# Patient Record
Sex: Female | Born: 2000 | Race: White | Hispanic: No | Marital: Single | Smoking: Never smoker
Health system: Southern US, Community
[De-identification: ages and names within clinical notes are randomized; demographics above are authoritative.]

## PROBLEM LIST (undated history)

## (undated) DIAGNOSIS — Z789 Other specified health status: Secondary | ICD-10-CM

## (undated) DIAGNOSIS — I10 Essential (primary) hypertension: Secondary | ICD-10-CM

## (undated) HISTORY — PX: NO PAST SURGERIES: SHX2092

---
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
## 2020-11-27 NOTE — L&D Delivery Note (Signed)
OB/GYN Faculty Practice Delivery Note  Pamela Cisneros is a 20 y.o. G1P0 s/p SVD at [redacted]w[redacted]d. She was admitted for IOL gHTN.   ROM: 6h 43m with clear fluid GBS Status: positive Maximum Maternal Temperature: 100.1  Labor Progress: Presented for IOL, had foley bulb placed while boarding in MAU and received cytotec x1, then was started on pitocin and AROMed and progressed to compelete  Delivery Date/Time: 0831 on 12/29 Delivery: Called to room and patient was complete and pushing. Head delivered LOA. No nuchal cord present. Shoulder and body delivered in usual fashion. Infant with spontaneous cry, placed on mother's abdomen, dried and stimulated. Cord clamped x 2 after 1-minute delay, and cut by father of baby Lars Mage. Cord blood drawn. Placenta delivered spontaneously with gentle cord traction. Fundus firm with massage and Pitocin. Labia, perineum, vagina, and cervix inspected and found  to have a 2nd degree perineal laceration with a left labial extension as well as a miniscule right periurethral. The 2nd degree laceration was repaired with a 3-0 vicryl and the labial extension was repaired with  a 4-0 monocryl. There was once interrupted stitch placed in the right periurethral tear.   Of note, there was significant bleeding from the perineal laceration and therefore patient was given TXA and there was excellent hemostasis once the repair was complete.  Placenta: intact, to L&D Complications: None Lacerations: 2nd degree perineal, left labial extension, and right periurethral  EBL: 700 Analgesia: epidural   Infant: female   APGARs 8,9   weight pending  Warner Mccreedy, MD, MPH OB Fellow, Faculty Practice Center for [facility], [facility] 11/24/2021, 9:30 AM

## 2021-01-07 ENCOUNTER — Other Ambulatory Visit: Payer: Self-pay

## 2021-01-07 ENCOUNTER — Emergency Department (HOSPITAL_COMMUNITY)
Admission: EM | Admit: 2021-01-07 | Discharge: 2021-01-08 | Disposition: A | Discharge status: Home / Self Care | Payer: Medicaid - Out of State | Attending: Emergency Medicine | Admitting: Emergency Medicine

## 2021-01-07 DIAGNOSIS — J029 Acute pharyngitis, unspecified: Secondary | ICD-10-CM | POA: Insufficient documentation

## 2021-01-07 DIAGNOSIS — Z5321 Procedure and treatment not carried out due to patient leaving prior to being seen by health care provider: Secondary | ICD-10-CM | POA: Diagnosis not present

## 2021-01-07 LAB — CBC
HCT: 37.4 % (ref 36.0–46.0)
Hemoglobin: 11 g/dL — ABNORMAL LOW (ref 12.0–15.0)
MCH: 21.7 pg — ABNORMAL LOW (ref 26.0–34.0)
MCHC: 29.4 g/dL — ABNORMAL LOW (ref 30.0–36.0)
MCV: 73.8 fL — ABNORMAL LOW (ref 80.0–100.0)
Platelets: 519 10*3/uL — ABNORMAL HIGH (ref 150–400)
RBC: 5.07 MIL/uL (ref 3.87–5.11)
RDW: 15.8 % — ABNORMAL HIGH (ref 11.5–15.5)
WBC: 14.3 10*3/uL — ABNORMAL HIGH (ref 4.0–10.5)
nRBC: 0 % (ref 0.0–0.2)

## 2021-01-07 LAB — BASIC METABOLIC PANEL
Anion gap: 11 (ref 5–15)
BUN: 11 mg/dL (ref 6–20)
CO2: 24 mmol/L (ref 22–32)
Calcium: 9 mg/dL (ref 8.9–10.3)
Chloride: 97 mmol/L — ABNORMAL LOW (ref 98–111)
Creatinine, Ser: 0.63 mg/dL (ref 0.44–1.00)
GFR, Estimated: >60 mL/min (ref >60)
Glucose, Bld: 112 mg/dL — ABNORMAL HIGH (ref 70–99)
Potassium: 4 mmol/L (ref 3.5–5.1)
Sodium: 132 mmol/L — ABNORMAL LOW (ref 135–145)

## 2021-01-07 LAB — GROUP A STREP BY PCR: Group A Strep by PCR: NOT DETECTED

## 2021-01-07 NOTE — ED Triage Notes (Addendum)
Pt presents to ED POV. Pt c/o sore throat. Pt reports that she had tonsillitis on L side in July and feels like this is similar. Pt reports that she wa told that her tonsils need to be remove and she has been unable to do so d/t insurance. Airway intact

## 2021-01-08 ENCOUNTER — Encounter (HOSPITAL_COMMUNITY): Payer: Self-pay | Admitting: Emergency Medicine

## 2021-01-08 ENCOUNTER — Other Ambulatory Visit: Payer: Self-pay

## 2021-01-08 ENCOUNTER — Ambulatory Visit (HOSPITAL_COMMUNITY)
Admission: EM | Admit: 2021-01-08 | Discharge: 2021-01-08 | Disposition: A | Discharge status: Home / Self Care | Payer: Medicaid - Out of State | Attending: Emergency Medicine | Admitting: Emergency Medicine

## 2021-01-08 DIAGNOSIS — R509 Fever, unspecified: Secondary | ICD-10-CM

## 2021-01-08 DIAGNOSIS — J029 Acute pharyngitis, unspecified: Secondary | ICD-10-CM | POA: Insufficient documentation

## 2021-01-08 DIAGNOSIS — J039 Acute tonsillitis, unspecified: Secondary | ICD-10-CM | POA: Insufficient documentation

## 2021-01-08 LAB — POCT RAPID STREP A, ED / UC: Streptococcus, Group A Screen (Direct): NEGATIVE

## 2021-01-08 MED ORDER — AZITHROMYCIN 250 MG PO TABS: 250.0000 mg | ORAL_TABLET | Freq: Every day | ORAL | 0 refills | Status: DC

## 2021-01-08 NOTE — ED Triage Notes (Signed)
Pt is present today with a sore throat, fever, and neck pain. Pt states that she just recovered from tonsillitis in December. Pt states that she needs to have her tonsils removed but is waiting on her record from [facility] to [facility]

## 2021-01-08 NOTE — Discharge Instructions (Addendum)
Take the Zithromax as directed.  Take Tylenol or ibuprofen as needed for discomfort.  Follow-up with your primary care provider or an ENT.

## 2021-01-08 NOTE — ED Provider Notes (Signed)
[facility]    CSN: [id number] Arrival date & time: 01/08/21  1503      History   Chief Complaint Chief Complaint  Patient presents with  . Fever  . Sore Throat    HPI Pamela Cisneros is a 20 y.o. female.   Patient presents with 3-day history of fever, sore throat, tender swollen lymph nodes in her neck.  She denies rash, cough, shortness of breath, vomiting, diarrhea, or other symptoms.  Treatment attempted at home with Tylenol.  Patient states she has a history of tonsillitis which was last treated in July 2021.  She attempted to be seen in the ED yesterday but left before being seen due to the wait time.  The history is provided by the patient and medical records.    History reviewed. No pertinent past medical history.  There are no problems to display for this patient.   History reviewed. No pertinent surgical history.  OB History   No obstetric history on file.      Home Medications    Prior to Admission medications   Medication Sig Start Date End Date Taking? Authorizing Provider  azithromycin (ZITHROMAX) 250 MG tablet Take 1 tablet (250 mg total) by mouth daily. Take first 2 tablets together, then 1 every day until finished. 01/08/21  Yes Mickie Bail, NP    Family History Family History  Problem Relation Age of Onset  . Healthy Mother   . Healthy Father     Social History Social History   Tobacco Use  . Smoking status: Never Smoker  . Smokeless tobacco: Never Used  Vaping Use  . Vaping Use: Never used  Substance Use Topics  . Alcohol use: Never  . Drug use: Never     Allergies   Penicillins   Review of Systems Review of Systems  Constitutional: Positive for fever. Negative for chills.  HENT: Positive for sore throat. Negative for ear pain and trouble swallowing.   Eyes: Negative for pain and visual disturbance.  Respiratory: Negative for cough and shortness of breath.   Cardiovascular: Negative for chest pain and palpitations.   Gastrointestinal: Negative for abdominal pain, diarrhea and vomiting.  Genitourinary: Negative for dysuria and hematuria.  Musculoskeletal: Negative for arthralgias and back pain.  Skin: Negative for color change and rash.  Neurological: Negative for seizures and syncope.  All other systems reviewed and are negative.    Physical Exam Triage Vital Signs ED Triage Vitals  Enc Vitals Group     BP      Pulse      Resp      Temp      Temp src      SpO2      Weight      Height      Head Circumference      Peak Flow      Pain Score      Pain Loc      Pain Edu?      Excl. in GC?    No data found.  Updated Vital Signs BP 138/89 (BP Location: Left Arm)   Pulse (!) 121   Temp 99.6 F (37.6 C) (Oral)   Resp 17   LMP 12/17/2020   SpO2 100%   Visual Acuity Right Eye Distance:   Left Eye Distance:   Bilateral Distance:    Right Eye Near:   Left Eye Near:    Bilateral Near:     Physical Exam Vitals and nursing note  reviewed.  Constitutional:      General: She is not in acute distress.    Appearance: She is well-developed and well-nourished.  HENT:     Head: Normocephalic and atraumatic.     Right Ear: Tympanic membrane normal.     Left Ear: Tympanic membrane normal.     Nose: Nose normal.     Mouth/Throat:     Mouth: Mucous membranes are moist.     Pharynx: Uvula midline. Posterior oropharyngeal erythema present.     Tonsils: 2+ on the right. 2+ on the left.  Eyes:     Conjunctiva/sclera: Conjunctivae normal.  Cardiovascular:     Rate and Rhythm: Normal rate and regular rhythm.     Heart sounds: Normal heart sounds.  Pulmonary:     Effort: Pulmonary effort is normal. No respiratory distress.     Breath sounds: Normal breath sounds.  Abdominal:     Palpations: Abdomen is soft.     Tenderness: There is no abdominal tenderness.  Musculoskeletal:        General: No edema.     Cervical back: Neck supple.  Skin:    General: Skin is warm and dry.     Findings:  No rash.  Neurological:     General: No focal deficit present.     Mental Status: She is alert and oriented to person, place, and time.     Gait: Gait normal.  Psychiatric:        Mood and Affect: Mood and affect and mood normal.        Behavior: Behavior normal.      UC Treatments / Results  Labs (all labs ordered are listed, but only abnormal results are displayed) Labs Reviewed  CULTURE, GROUP A STREP [facility])  POCT RAPID STREP A, ED / UC    EKG   Radiology No results found.  Procedures Procedures (including critical care time)  Medications Ordered in UC Medications - No data to display  Initial Impression / Assessment and Plan / UC Course  I have reviewed the triage vital signs and the nursing notes.  Pertinent labs & imaging results that were available during my care of the patient were reviewed by me and considered in my medical decision making (see chart for details).   Acute tonsillitis and sore throat.  Rapid strep negative; culture pending.  Treating with Zithromax; Tylenol or ibuprofen as needed for discomfort.  Instructed patient to follow-up with her PCP or an ENT.  She agrees to plan of care.   Final Clinical Impressions(s) / UC Diagnoses   Final diagnoses:  Acute tonsillitis, unspecified etiology  Sore throat     Discharge Instructions     Take the Zithromax as directed.  Take Tylenol or ibuprofen as needed for discomfort.  Follow-up with your primary care provider or an ENT.        ED Prescriptions    Medication Sig Dispense Auth. Provider   azithromycin (ZITHROMAX) 250 MG tablet Take 1 tablet (250 mg total) by mouth daily. Take first 2 tablets together, then 1 every day until finished. 6 tablet Mickie Bail, NP     PDMP not reviewed this encounter.   Mickie Bail, NP 01/08/21 [phone]

## 2021-01-11 LAB — CULTURE, GROUP A STREP (THRC)

## 2021-03-29 ENCOUNTER — Other Ambulatory Visit: Payer: Self-pay

## 2021-03-29 ENCOUNTER — Ambulatory Visit (INDEPENDENT_AMBULATORY_CARE_PROVIDER_SITE_OTHER): Payer: Medicaid Other

## 2021-03-29 ENCOUNTER — Encounter: Payer: Self-pay | Admitting: Obstetrics and Gynecology

## 2021-03-29 VITALS — BP 131/83 | HR 72

## 2021-03-29 DIAGNOSIS — Z3201 Encounter for pregnancy test, result positive: Secondary | ICD-10-CM

## 2021-03-29 DIAGNOSIS — Z3200 Encounter for pregnancy test, result unknown: Secondary | ICD-10-CM

## 2021-03-29 LAB — POCT URINE PREGNANCY: Preg Test, Ur: POSITIVE — AB

## 2021-03-29 NOTE — Progress Notes (Signed)
Ms. Riche presents today for UPT. She does not have any pregnancy complaints.  LMP:02/18/2021    OBJECTIVE: Appears well, in no apparent distress.  OB History   No obstetric history on file.    Home UPT Result: positive  In-Office UPT result:positive  I have reviewed the patient's medical, obstetrical, social, and family histories, and medications.   ASSESSMENT: Positive pregnancy test    PLAN Prenatal care to be completed at: Patient will call back to schedule a new ob appointment.

## 2021-04-20 ENCOUNTER — Ambulatory Visit (INDEPENDENT_AMBULATORY_CARE_PROVIDER_SITE_OTHER): Payer: Medicaid Other

## 2021-04-20 ENCOUNTER — Other Ambulatory Visit: Payer: Self-pay

## 2021-04-20 VITALS — BP 119/77 | HR 82 | Ht 64.0 in | Wt 204.8 lb

## 2021-04-20 DIAGNOSIS — O3680X0 Pregnancy with inconclusive fetal viability, not applicable or unspecified: Secondary | ICD-10-CM

## 2021-04-20 DIAGNOSIS — Z3A08 8 weeks gestation of pregnancy: Secondary | ICD-10-CM

## 2021-04-20 DIAGNOSIS — Z3401 Encounter for supervision of normal first pregnancy, first trimester: Secondary | ICD-10-CM | POA: Insufficient documentation

## 2021-04-20 DIAGNOSIS — O219 Vomiting of pregnancy, unspecified: Secondary | ICD-10-CM

## 2021-04-20 MED ORDER — BLOOD PRESSURE KIT DEVI: 1.0000 | 0 refills | Status: AC

## 2021-04-20 MED ORDER — DICLEGIS 10-10 MG PO TBEC
2.0000 | DELAYED_RELEASE_TABLET | Freq: Every day | ORAL | 2 refills | Status: DC
Start: 2021-04-20 — End: 2021-05-16

## 2021-04-20 NOTE — Progress Notes (Signed)
Agree with A & P. 

## 2021-04-20 NOTE — Progress Notes (Signed)
New OB Intake  I connected with  Pamela Cisneros on 04/20/21 at  1:15 PM EDT by in office and verified that I am speaking with the correct person using two identifiers. Nurse is located at [facility] and pt is located at in office.  I discussed the limitations, risks, security and privacy concerns of performing an evaluation and management service by telephone and the availability of in person appointments. I also discussed with the patient that there may be a patient responsible charge related to this service. The patient expressed understanding and agreed to proceed.  I explained I am completing New OB Intake today. We discussed her EDD of 11/25/21 that is based on LMP of 02/18/21. Pt is G1/P0. I reviewed her allergies, medications, Medical/Surgical/OB history, and appropriate screenings. I informed her of [facility] services. Based on history, this is a/an uncomplicated pregnancy.  Patient Active Problem List   Diagnosis Date Noted  . Encounter for supervision of normal first pregnancy in first trimester 04/20/2021    Concerns addressed today  Delivery Plans:  Plans to deliver at [facility].   MyChart/Babyscripts MyChart access verified. I explained pt will have some visits in office and some virtually. Babyscripts instructions given and order placed. Patient verifies receipt of registration text/e-mail. Account successfully created and app downloaded.  Blood Pressure Cuff Blood pressure cuff ordered for patient to pick-up from [employer]. Explained after first prenatal appt pt will check weekly and document in Babyscripts.  Anatomy US Explained first scheduled [place] will be around 19 weeks. Dating and viability scan performed today  Labs Discussed Avelina Laine genetic screening with patient. Would like both Panorama and Horizon drawn at new OB visit. Routine prenatal labs needed.  Covid Vaccine Patient has covid vaccine.   Social Determinants of Health . Food Insecurity: Patient denies food  insecurity. . WIC Referral: Patient is interested in referral to [facility].  . Transportation: Patient denies transportation needs. . Childcare: Discussed no children allowed at ultrasound appointments. Offered childcare services; patient declines childcare services at this time.  First visit review I reviewed new OB appt with pt. I explained she will have a pelvic exam, ob bloodwork with genetic screening, and PAP smear. Explained pt will be seen by Coral Ceo at first visit; encounter routed to appropriate provider. Explained that patient will be seen by pregnancy navigator following visit with provider.  Hamilton Capri, RN 04/20/2021  1:39 PM

## 2021-05-04 ENCOUNTER — Encounter: Payer: Medicaid Other | Admitting: Obstetrics

## 2021-05-16 ENCOUNTER — Other Ambulatory Visit (HOSPITAL_COMMUNITY)
Admission: RE | Admit: 2021-05-16 | Discharge: 2021-05-16 | Disposition: A | Discharge status: Home / Self Care | Payer: Medicaid Other | Source: Ambulatory Visit | Attending: Obstetrics | Admitting: Obstetrics

## 2021-05-16 ENCOUNTER — Other Ambulatory Visit: Payer: Self-pay

## 2021-05-16 ENCOUNTER — Ambulatory Visit (INDEPENDENT_AMBULATORY_CARE_PROVIDER_SITE_OTHER): Payer: Medicaid Other | Admitting: Obstetrics

## 2021-05-16 ENCOUNTER — Encounter: Payer: Self-pay | Admitting: Obstetrics

## 2021-05-16 VITALS — BP 136/80 | HR 105 | Wt 209.0 lb

## 2021-05-16 DIAGNOSIS — Z3480 Encounter for supervision of other normal pregnancy, unspecified trimester: Secondary | ICD-10-CM | POA: Insufficient documentation

## 2021-05-16 DIAGNOSIS — Z3A12 12 weeks gestation of pregnancy: Secondary | ICD-10-CM | POA: Diagnosis not present

## 2021-05-16 DIAGNOSIS — Z3401 Encounter for supervision of normal first pregnancy, first trimester: Secondary | ICD-10-CM

## 2021-05-16 DIAGNOSIS — O26891 Other specified pregnancy related conditions, first trimester: Secondary | ICD-10-CM

## 2021-05-16 DIAGNOSIS — Z91410 Personal history of adult physical and sexual abuse: Secondary | ICD-10-CM

## 2021-05-16 DIAGNOSIS — T7421XA Adult sexual abuse, confirmed, initial encounter: Secondary | ICD-10-CM

## 2021-05-16 DIAGNOSIS — O219 Vomiting of pregnancy, unspecified: Secondary | ICD-10-CM

## 2021-05-16 LAB — HEPATITIS C ANTIBODY: HCV Ab: NEGATIVE

## 2021-05-16 LAB — OB RESULTS CONSOLE GC/CHLAMYDIA: Gonorrhea: NEGATIVE

## 2021-05-16 MED ORDER — DICLEGIS 10-10 MG PO TBEC: 2.0000 | DELAYED_RELEASE_TABLET | Freq: Every day | ORAL | 2 refills | Status: DC

## 2021-05-16 MED ORDER — VITAFOL GUMMIES 3.33-0.333-34.8 MG PO CHEW: 3.0000 | CHEWABLE_TABLET | Freq: Every day | ORAL | 11 refills | Status: DC

## 2021-05-16 NOTE — Progress Notes (Signed)
NOB [redacted]w[redacted]d.  Intake completed on 04/20/21.  U/S on 04/20/21  Genetic Testing: Desired and wants to know gender.  Pap: N/A due to age.  CC: Nausea and Vomiting needs Rx . Pt having a hard time keeping down water and food. *Pt notes rushing to appt so she would not be late.Pt is also excited.B/P a little elevated.  *[employer] tablet offered.
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
## 2021-05-17 ENCOUNTER — Other Ambulatory Visit: Payer: Self-pay | Admitting: Obstetrics

## 2021-05-17 DIAGNOSIS — K5901 Slow transit constipation: Secondary | ICD-10-CM

## 2021-05-17 DIAGNOSIS — O99019 Anemia complicating pregnancy, unspecified trimester: Secondary | ICD-10-CM

## 2021-05-17 LAB — CERVICOVAGINAL ANCILLARY ONLY
Chlamydia: NEGATIVE
Comment: NEGATIVE
Comment: NEGATIVE
Comment: NORMAL
Neisseria Gonorrhea: NEGATIVE
Trichomonas: NEGATIVE

## 2021-05-17 LAB — OBSTETRIC PANEL, INCLUDING HIV
Antibody Screen: NEGATIVE
Basophils Absolute: 0.1 10*3/uL (ref 0.0–0.2)
Basos: 1 %
EOS (ABSOLUTE): 0.1 10*3/uL (ref 0.0–0.4)
Eos: 1 %
HIV Screen 4th Generation wRfx: NONREACTIVE
Hematocrit: 31.9 % — ABNORMAL LOW (ref 34.0–46.6)
Hemoglobin: 9.9 g/dL — ABNORMAL LOW (ref 11.1–15.9)
Hepatitis B Surface Ag: NEGATIVE
Immature Grans (Abs): 0.1 10*3/uL (ref 0.0–0.1)
Immature Granulocytes: 1 %
Lymphocytes Absolute: 1.8 10*3/uL (ref 0.7–3.1)
Lymphs: 16 %
MCH: 21.9 pg — ABNORMAL LOW (ref 26.6–33.0)
MCHC: 31 g/dL — ABNORMAL LOW (ref 31.5–35.7)
MCV: 71 fL — ABNORMAL LOW (ref 79–97)
Monocytes Absolute: 0.9 10*3/uL (ref 0.1–0.9)
Monocytes: 8 %
Neutrophils Absolute: 8.3 10*3/uL — ABNORMAL HIGH (ref 1.4–7.0)
Neutrophils: 73 %
Platelets: 555 10*3/uL — ABNORMAL HIGH (ref 150–450)
RBC: 4.52 x10E6/uL (ref 3.77–5.28)
RDW: 15.8 % — ABNORMAL HIGH (ref 11.7–15.4)
RPR Ser Ql: NONREACTIVE
Rh Factor: POSITIVE
Rubella Antibodies, IGG: 3.65 index (ref >0.99)
WBC: 11.2 10*3/uL — ABNORMAL HIGH (ref 3.4–10.8)

## 2021-05-17 LAB — HEPATITIS C ANTIBODY: Hep C Virus Ab: <0.1 s/co ratio (ref 0.0–0.9)

## 2021-05-17 MED ORDER — FERROUS SULFATE 325 (65 FE) MG PO TABS: 325.0000 mg | ORAL_TABLET | ORAL | 5 refills | Status: DC

## 2021-05-17 MED ORDER — DOCUSATE SODIUM 100 MG PO CAPS: 100.0000 mg | ORAL_CAPSULE | Freq: Two times a day (BID) | ORAL | 11 refills | Status: DC

## 2021-05-20 ENCOUNTER — Other Ambulatory Visit: Payer: Self-pay | Admitting: Obstetrics

## 2021-05-20 DIAGNOSIS — O2340 Unspecified infection of urinary tract in pregnancy, unspecified trimester: Secondary | ICD-10-CM

## 2021-05-20 LAB — URINE CULTURE, OB REFLEX

## 2021-05-20 LAB — CULTURE, OB URINE

## 2021-05-20 MED ORDER — CEFUROXIME AXETIL 500 MG PO TABS: 500.0000 mg | ORAL_TABLET | Freq: Two times a day (BID) | ORAL | 0 refills | Status: DC

## 2021-05-23 ENCOUNTER — Telehealth: Payer: Self-pay

## 2021-05-23 ENCOUNTER — Encounter: Payer: Self-pay | Admitting: Obstetrics

## 2021-05-23 NOTE — Telephone Encounter (Signed)
Call patient- no answer or voice mail.

## 2021-05-23 NOTE — Telephone Encounter (Signed)
-----   Message from Charles A Harper, MD sent at 05/20/2021  3:18 PM EDT ----- Ceftin Rx for GBS positive UTI 

## 2021-05-24 ENCOUNTER — Telehealth: Payer: Self-pay

## 2021-05-24 NOTE — Telephone Encounter (Signed)
Call patient x 2 no answer

## 2021-05-24 NOTE — Telephone Encounter (Signed)
-----   Message from Brock Bad, MD sent at 05/20/2021  3:18 PM EDT ----- Ceftin Rx for GBS positive UTI

## 2021-05-25 ENCOUNTER — Encounter: Payer: Self-pay | Admitting: Obstetrics

## 2021-06-13 ENCOUNTER — Ambulatory Visit (INDEPENDENT_AMBULATORY_CARE_PROVIDER_SITE_OTHER): Payer: Medicaid Other | Admitting: Advanced Practice Midwife

## 2021-06-13 ENCOUNTER — Other Ambulatory Visit: Payer: Self-pay

## 2021-06-13 ENCOUNTER — Ambulatory Visit (INDEPENDENT_AMBULATORY_CARE_PROVIDER_SITE_OTHER): Payer: Medicaid Other | Admitting: Licensed Clinical Social Worker

## 2021-06-13 VITALS — BP 132/83 | HR 100 | Wt 208.0 lb

## 2021-06-13 DIAGNOSIS — K5901 Slow transit constipation: Secondary | ICD-10-CM

## 2021-06-13 DIAGNOSIS — O99019 Anemia complicating pregnancy, unspecified trimester: Secondary | ICD-10-CM

## 2021-06-13 DIAGNOSIS — Z62810 Personal history of physical and sexual abuse in childhood: Secondary | ICD-10-CM

## 2021-06-13 DIAGNOSIS — Z8659 Personal history of other mental and behavioral disorders: Secondary | ICD-10-CM

## 2021-06-13 DIAGNOSIS — Z3A19 19 weeks gestation of pregnancy: Secondary | ICD-10-CM

## 2021-06-13 DIAGNOSIS — Z3A16 16 weeks gestation of pregnancy: Secondary | ICD-10-CM

## 2021-06-13 DIAGNOSIS — Z3480 Encounter for supervision of other normal pregnancy, unspecified trimester: Secondary | ICD-10-CM

## 2021-06-13 DIAGNOSIS — O2342 Unspecified infection of urinary tract in pregnancy, second trimester: Secondary | ICD-10-CM

## 2021-06-13 NOTE — Patient Instructions (Addendum)
?  Considering Waterbirth? ?Guide for patients at [facility] ([facility]) ?Why consider waterbirth? ?Gentle birth for babies  ?Less pain medicine used in labor  ?May allow for passive descent/less pushing  ?May reduce perineal tears  ?More mobility and instinctive maternal position changes  ?Increased maternal relaxation  ? ?Is waterbirth safe? What are the risks of infection, drowning or other complications? ?Infection:  ?Very low risk (3.7 % for tub vs 4.8% for bed)  ?7 in 8000 waterbirths with documented infection  ?Poorly cleaned equipment most common cause  ?Slightly lower group B strep transmission rate  ?Drowning  ?Maternal:  ?Very low risk  ?Related to seizures or fainting  ?Newborn:  ?Very low risk. No evidence of increased risk of respiratory problems in multiple large studies  ?Physiological protection from breathing under water  ?Avoid underwater birth if there are any fetal complications  ?Once baby's head is out of the water, keep it out.  ?Birth complication  ?Some reports of cord trauma, but risk decreased by bringing baby to surface gradually  ?No evidence of increased risk of shoulder dystocia. Mothers can usually change positions faster in water than in a bed, possibly aiding the maneuvers to free the shoulder.  ? ?There are 2 things you MUST do to have a waterbirth with [facility]: ?Attend a waterbirth class at [facility] [place]   ?3rd Wednesday of every month from 7-9 pm (virtual during COVID) ?Free ?Register online at [email or web address] or [email or web address] or by calling [phone] ?Bring us the certificate from the class to your prenatal appointment or send via MyChart ?Meet with a midwife at 36 weeks* to see if you can still plan a waterbirth and to sign the consent.  ? ?*We also recommend that you schedule as many of your prenatal visits with a midwife as possible.   ? ?Helpful information: ?You may want to bring a bathing suit top to the hospital  to wear during labor but this is optional.  All other supplies are provided by the hospital. ?Please arrive at the hospital with signs of active labor, and do not wait at home until late in labor. It takes 45 min- 2 hours for COVID testing, fetal monitoring, and check in to your room to take place, plus transport and filling of the waterbirth tub.   ? ?Things that would prevent you from having a waterbirth: ?Unknown or Positive COVID-19 diagnosis upon admission to hospital* ?Premature, <37wks  ?Previous cesarean birth  ?Presence of thick meconium-stained fluid  ?Multiple gestation (Twins, triplets, etc.)  ?Uncontrolled diabetes or gestational diabetes requiring medication  ?Hypertension diagnosed in pregnancy or preexisting hypertension (gestational hypertension, preeclampsia, or chronic hypertension) ?Fetal growth restriction (your baby measures less than 10th percentile on ultrasound) ?Heavy vaginal bleeding  ?Non-reassuring fetal heart rate  ?Active infection (MRSA, etc.). Group B Strep is NOT a contraindication for waterbirth.  ?If your labor has to be induced and induction method requires continuous monitoring of the baby's heart rate  ?Other risks/issues identified by your obstetrical provider  ? ?Please remember that birth is unpredictable. Under certain unforeseeable circumstances your provider may advise against giving birth in the tub. These decisions will be made on a case-by-case basis and with the safety of you and your baby as our highest priority. ? ? ?*Please remember that in order to have a waterbirth, you must test Negative to COVID-19 upon admission to the hospital. ? ?Updated 03/07/21 ? ?

## 2021-06-13 NOTE — Progress Notes (Signed)
   PRENATAL VISIT NOTE  Subjective:  Pamela Cisneros is a 20 y.o. G1P0 at [redacted]w[redacted]d being seen today for ongoing prenatal care.  She is currently monitored for the following issues for this low-risk pregnancy and has Encounter for supervision of normal first pregnancy in first trimester on their problem list.  Patient reports no complaints.  Contractions: Irritability. Vag. Bleeding: None.  Movement: Present. Denies leaking of fluid.   The following portions of the patient's history were reviewed and updated as appropriate: allergies, current medications, past family history, past medical history, past social history, past surgical history and problem list.   Objective:   Vitals:   06/13/21 1033  BP: 132/83  Pulse: 100  Weight: 208 lb (94.3 kg)    Fetal Status: Fetal Heart Rate (bpm): 154   Movement: Present     General:  Alert, oriented and cooperative. Patient is in no acute distress.  Skin: Skin is warm and dry. No rash noted.   Cardiovascular: Normal heart rate noted  Respiratory: Normal respiratory effort, no problems with respiration noted  Abdomen: Soft, gravid, appropriate for gestational age.  Pain/Pressure: Absent     Pelvic: Cervical exam deferred        Extremities: Normal range of motion.  Edema: None  Mental Status: Normal mood and affect. Normal behavior. Normal judgment and thought content.   Assessment and Plan:  Pregnancy: G1P0 at [redacted]w[redacted]d 1. Supervision of other normal pregnancy, antepartum --Anticipatory guidance about next visits/weeks of pregnancy given. --Interested in waterbirth, questions answered. Pt to enroll in class, see midwives for most visits. Discussed waterbirth as option for low risk pregnancy. Pregnancy is hard to predict and things may arise that prevent immersion in water but labor can be supported with freedom of movement, birthing ball, shower and birth can be supported in position of pt choosing, etc, even if waterbirth becomes unavailable. Pt states  understanding. --FH 2-3 cm below umbilicus today, wnl --Next visit in 4 weeks - [place] MFM OB COMP + 14 WK; Future  2. Anemia affecting pregnancy, antepartum --Taking PNV and oral iron, no s/sx  3. Slow transit constipation --improved  4. [redacted] weeks gestation of pregnancy  5. Urinary tract infection in mother during second trimester of pregnancy --TOC today - Culture, OB Urine   Preterm labor symptoms and general obstetric precautions including but not limited to vaginal bleeding, contractions, leaking of fluid and fetal movement were reviewed in detail with the patient. Please refer to After Visit Summary for other counseling recommendations.   Return in about 4 weeks (around 07/11/2021).  Future Appointments  Date Time Provider Department Center  07/11/2021  8:55 AM [facility], Wilmer Floor, CNM [facility] None     Sharen Counter, CNM
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
## 2021-07-05 ENCOUNTER — Ambulatory Visit: Payer: Medicaid Other | Attending: Advanced Practice Midwife

## 2021-07-05 ENCOUNTER — Other Ambulatory Visit: Payer: Self-pay | Admitting: *Deleted

## 2021-07-05 ENCOUNTER — Other Ambulatory Visit: Payer: Self-pay | Admitting: Advanced Practice Midwife

## 2021-07-05 ENCOUNTER — Other Ambulatory Visit: Payer: Self-pay

## 2021-07-05 DIAGNOSIS — Z6835 Body mass index (BMI) 35.0-35.9, adult: Secondary | ICD-10-CM

## 2021-07-05 DIAGNOSIS — Z3480 Encounter for supervision of other normal pregnancy, unspecified trimester: Secondary | ICD-10-CM

## 2021-07-08 ENCOUNTER — Inpatient Hospital Stay (HOSPITAL_COMMUNITY)
Admission: AD | Admit: 2021-07-08 | Discharge: 2021-07-08 | Disposition: A | Discharge status: Home / Self Care | Payer: Medicaid Other | Attending: Obstetrics & Gynecology | Admitting: Obstetrics & Gynecology

## 2021-07-08 ENCOUNTER — Encounter (HOSPITAL_COMMUNITY): Payer: Self-pay | Admitting: Family Medicine

## 2021-07-08 ENCOUNTER — Other Ambulatory Visit: Payer: Self-pay

## 2021-07-08 DIAGNOSIS — O26892 Other specified pregnancy related conditions, second trimester: Secondary | ICD-10-CM | POA: Diagnosis present

## 2021-07-08 DIAGNOSIS — O132 Gestational [pregnancy-induced] hypertension without significant proteinuria, second trimester: Secondary | ICD-10-CM | POA: Diagnosis not present

## 2021-07-08 DIAGNOSIS — D649 Anemia, unspecified: Secondary | ICD-10-CM

## 2021-07-08 DIAGNOSIS — M79602 Pain in left arm: Secondary | ICD-10-CM | POA: Insufficient documentation

## 2021-07-08 DIAGNOSIS — O99891 Other specified diseases and conditions complicating pregnancy: Secondary | ICD-10-CM | POA: Diagnosis not present

## 2021-07-08 DIAGNOSIS — Z880 Allergy status to penicillin: Secondary | ICD-10-CM | POA: Diagnosis not present

## 2021-07-08 DIAGNOSIS — Z3A20 20 weeks gestation of pregnancy: Secondary | ICD-10-CM

## 2021-07-08 DIAGNOSIS — M7918 Myalgia, other site: Secondary | ICD-10-CM

## 2021-07-08 DIAGNOSIS — Z3401 Encounter for supervision of normal first pregnancy, first trimester: Secondary | ICD-10-CM

## 2021-07-08 DIAGNOSIS — O99012 Anemia complicating pregnancy, second trimester: Secondary | ICD-10-CM | POA: Insufficient documentation

## 2021-07-08 DIAGNOSIS — Z79899 Other long term (current) drug therapy: Secondary | ICD-10-CM | POA: Diagnosis not present

## 2021-07-08 DIAGNOSIS — O99019 Anemia complicating pregnancy, unspecified trimester: Secondary | ICD-10-CM | POA: Insufficient documentation

## 2021-07-08 DIAGNOSIS — O139 Gestational [pregnancy-induced] hypertension without significant proteinuria, unspecified trimester: Secondary | ICD-10-CM | POA: Insufficient documentation

## 2021-07-08 HISTORY — DX: Other specified health status: Z78.9

## 2021-07-08 LAB — COMPREHENSIVE METABOLIC PANEL
ALT: 27 U/L (ref 0–44)
AST: 19 U/L (ref 15–41)
Albumin: 2.9 g/dL — ABNORMAL LOW (ref 3.5–5.0)
Alkaline Phosphatase: 71 U/L (ref 38–126)
Anion gap: 8 (ref 5–15)
BUN: 6 mg/dL (ref 6–20)
CO2: 22 mmol/L (ref 22–32)
Calcium: 8.9 mg/dL (ref 8.9–10.3)
Chloride: 102 mmol/L (ref 98–111)
Creatinine, Ser: 0.47 mg/dL (ref 0.44–1.00)
GFR, Estimated: >60 mL/min (ref >60)
Glucose, Bld: 101 mg/dL — ABNORMAL HIGH (ref 70–99)
Potassium: 4 mmol/L (ref 3.5–5.1)
Sodium: 132 mmol/L — ABNORMAL LOW (ref 135–145)
Total Bilirubin: 0.2 mg/dL — ABNORMAL LOW (ref 0.3–1.2)
Total Protein: 7 g/dL (ref 6.5–8.1)

## 2021-07-08 LAB — CBC
HCT: 31 % — ABNORMAL LOW (ref 36.0–46.0)
Hemoglobin: 9.6 g/dL — ABNORMAL LOW (ref 12.0–15.0)
MCH: 22 pg — ABNORMAL LOW (ref 26.0–34.0)
MCHC: 31 g/dL (ref 30.0–36.0)
MCV: 70.9 fL — ABNORMAL LOW (ref 80.0–100.0)
Platelets: 548 10*3/uL — ABNORMAL HIGH (ref 150–400)
RBC: 4.37 MIL/uL (ref 3.87–5.11)
RDW: 17.2 % — ABNORMAL HIGH (ref 11.5–15.5)
WBC: 14.6 10*3/uL — ABNORMAL HIGH (ref 4.0–10.5)
nRBC: 0.1 % (ref 0.0–0.2)

## 2021-07-08 LAB — PROTEIN / CREATININE RATIO, URINE
Creatinine, Urine: 105.3 mg/dL
Protein Creatinine Ratio: 0.27 mg/mg{Cre} — ABNORMAL HIGH (ref 0.00–0.15)
Total Protein, Urine: 28 mg/dL

## 2021-07-08 MED ORDER — FERROUS SULFATE 325 (65 FE) MG PO TABS: 325.0000 mg | ORAL_TABLET | ORAL | 5 refills | Status: DC

## 2021-07-08 NOTE — MAU Provider Note (Signed)
History     CSN: [id number]  Arrival date and time: 07/08/21 1842   Event Date/Time   First Provider Initiated Contact with Patient 07/08/21 2003      Chief Complaint  Patient presents with   Hypertension   Arm Pain   20 y.o. G1 _0 .0 wks presenting with left arm pain and elevated BP. Reports onset of sx today. Pain is located at left inner elbow and occasionally radiates to left shoulder. States when she extends it feels like it pulls in her bicep region. Rates pain 2/10. She used an ice pack and Tylenol but it didn't help much. Denies injury or malpositioning of the area. She checked her BP and was 150/100. Denies HA, visual disturbances, RUQ pain, SOB, and CP. Reports good FM. No pregnancy complaints.      OB History     Gravida  1   Para      Term      Preterm      AB      Living         SAB      IAB      Ectopic      Multiple      Live Births              Past Medical History:  Diagnosis Date   Medical history non-contributory     Past Surgical History:  Procedure Laterality Date   NO PAST SURGERIES      Family History  Problem Relation Age of Onset   Healthy Mother    Healthy Father     Social History   Tobacco Use   Smoking status: Never   Smokeless tobacco: Never  Vaping Use   Vaping Use: Never used  Substance Use Topics   Alcohol use: Never   Drug use: Never    Allergies:  Allergies  Allergen Reactions   Penicillins Hives    Medications Prior to Admission  Medication Sig Dispense Refill Last Dose   DICLEGIS 10-10 MG TBEC Take 2 tablets by mouth at bedtime. If symptoms persist, add one tablet in the morning and one in the afternoon 100 tablet 2 07/07/2021   Prenatal Vit-Fe Phos-FA-Omega (VITAFOL GUMMIES) 3.33-0.333-34.8 MG CHEW Chew 3 tablets by mouth daily before breakfast. 90 tablet 11 07/08/2021   Blood Pressure Monitoring (BLOOD PRESSURE KIT) DEVI 1 kit by Does not apply route once a week. (Patient not taking:  Reported on 06/13/2021) 1 each 0    cefUROXime (CEFTIN) 500 MG tablet Take 1 tablet (500 mg total) by mouth 2 (two) times daily with a meal. (Patient not taking: Reported on 06/13/2021) 14 tablet 0    docusate sodium (COLACE) 100 MG capsule Take 1 capsule (100 mg total) by mouth 2 (two) times daily. (Patient not taking: Reported on 06/13/2021) 60 capsule 11    ferrous sulfate 325 (65 FE) MG tablet Take 1 tablet (325 mg total) by mouth every other day. (Patient not taking: Reported on 06/13/2021) 60 tablet 5    Prenatal MV & Min w/FA-DHA (PRENATAL GUMMIES) 0.18-25 MG CHEW Chew by mouth.       Review of Systems  Respiratory:  Negative for shortness of breath.   Cardiovascular:  Negative for chest pain.  Neurological:  Negative for headaches.  Physical Exam   Blood pressure 133/81, pulse 99, temperature 98.5 F (36.9 C), temperature source Oral, resp. rate 17, height _1  (1.626 m), weight 97.5 kg, last menstrual period 02/18/2021, SpO2 98 %.  Patient Vitals for the past 24 hrs:  BP Temp Temp src Pulse Resp SpO2 Height Weight  07/08/21 2045 124/67 -- -- 91 -- -- -- --  07/08/21 2044 -- -- -- -- -- 98 % -- --  07/08/21 2035 -- -- -- -- -- 98 % -- --  07/08/21 2030 128/67 -- -- 94 -- 98 % -- --  07/08/21 2015 137/82 -- -- 94 -- -- -- --  07/08/21 2014 -- -- -- -- -- 99 % -- --  07/08/21 2000 133/81 -- -- 99 -- -- -- --  07/08/21 1959 -- -- -- -- -- 98 % -- --  07/08/21 1956 (!) 144/80 -- -- (!) 108 -- -- -- --  07/08/21 1920 (!) 141/83 98.5 F (36.9 C) Oral 99 17 99 % _0  (1.626 m) 97.5 kg   Physical Exam Vitals and nursing note reviewed.  Constitutional:      Appearance: Normal appearance.  HENT:     Head: Normocephalic and atraumatic.  Cardiovascular:     Rate and Rhythm: Normal rate.  Pulmonary:     Effort: Pulmonary effort is normal. No respiratory distress.  Musculoskeletal:        General: Normal range of motion.     Right upper arm: Normal.     Left upper arm: Normal.      Right elbow: Normal.     Left elbow: Normal. No swelling or deformity. Normal range of motion. No tenderness.     Right forearm: Normal.     Left forearm: Normal.     Right wrist: Normal.     Left wrist: Normal.     Right hand: Normal.     Left hand: Normal.     Cervical back: Normal range of motion.  Skin:    General: Skin is warm and dry.  Neurological:     General: No focal deficit present.     Mental Status: She is alert and oriented to person, place, and time.  Psychiatric:        Mood and Affect: Mood is anxious.        Behavior: Behavior normal.  FHT: 165  Results for orders placed or performed during the hospital encounter of 07/08/21 (from the past 24 hour(s))  Protein / creatinine ratio, urine     Status: Abnormal   Collection Time: 07/08/21  7:41 PM  Result Value Ref Range   Creatinine, Urine 105.30 mg/dL   Total Protein, Urine 28 mg/dL   Protein Creatinine Ratio 0.27 (H) 0.00 - 0.15 mg/mg[Cre]  CBC     Status: Abnormal   Collection Time: 07/08/21  7:50 PM  Result Value Ref Range   WBC 14.6 (H) 4.0 - 10.5 K/uL   RBC 4.37 3.87 - 5.11 MIL/uL   Hemoglobin 9.6 (L) 12.0 - 15.0 g/dL   HCT 31.0 (L) 36.0 - 46.0 %   MCV 70.9 (L) 80.0 - 100.0 fL   MCH 22.0 (L) 26.0 - 34.0 pg   MCHC 31.0 30.0 - 36.0 g/dL   RDW 17.2 (H) 11.5 - 15.5 %   Platelets 548 (H) 150 - 400 K/uL   nRBC 0.1 0.0 - 0.2 %  Comprehensive metabolic panel     Status: Abnormal   Collection Time: 07/08/21  7:50 PM  Result Value Ref Range   Sodium 132 (L) 135 - 145 mmol/L   Potassium 4.0 3.5 - 5.1 mmol/L   Chloride 102 98 - 111 mmol/L   CO2 22 22 - 32  mmol/L   Glucose, Bld 101 (H) 70 - 99 mg/dL   BUN 6 6 - 20 mg/dL   Creatinine, Ser 0.47 0.44 - 1.00 mg/dL   Calcium 8.9 8.9 - 10.3 mg/dL   Total Protein 7.0 6.5 - 8.1 g/dL   Albumin 2.9 (L) 3.5 - 5.0 g/dL   AST 19 15 - 41 U/L   ALT 27 0 - 44 U/L   Alkaline Phosphatase 71 38 - 126 U/L   Total Bilirubin 0.2 (L) 0.3 - 1.2 mg/dL   GFR, Estimated >60 >60  mL/min   Anion gap 8 5 - 15   MAU Course  Procedures  MDM Labs ordered and reviewed. No signs of PEC. Pain in left arm likely MSK and we discussed comfort measures. Will supplement Fe. Stable for discharge home.  Assessment and Plan   1. Encounter for supervision of normal first pregnancy in first trimester   2. [redacted] weeks gestation of pregnancy   3. Musculoskeletal pain of left upper extremity   4. Transient hypertension of pregnancy in second trimester   5. Anemia affecting pregnancy, antepartum    Discharge home [facility] as scheduled Rx FeSO4 Return precautions  Allergies as of 07/08/2021       Reactions   Penicillins Hives        Medication List     STOP taking these medications    cefUROXime 500 MG tablet Commonly known as: CEFTIN   docusate sodium 100 MG capsule Commonly known as: Colace   Vitafol Gummies 3.33-0.333-34.8 MG Chew       TAKE these medications    Blood Pressure Kit Devi 1 kit by Does not apply route once a week.   Diclegis 10-10 MG Tbec Generic drug: Doxylamine-Pyridoxine Take 2 tablets by mouth at bedtime. If symptoms persist, add one tablet in the morning and one in the afternoon   ferrous sulfate 325 (65 FE) MG tablet Take 1 tablet (325 mg total) by mouth every other day.   Prenatal Gummies 0.18-25 MG Chew Chew by mouth.       Julianne Handler, CNM 07/08/2021, 8:14 PM

## 2021-07-08 NOTE — MAU Note (Signed)
Pt reports last pm she started having pain in her left arm, movement made pain worse. This pm she checked her b/p 158/91. Later it was 153/97. Denies headache. Denies vag bleeding. Denies abd pain.

## 2021-07-11 ENCOUNTER — Other Ambulatory Visit: Payer: Self-pay

## 2021-07-11 ENCOUNTER — Ambulatory Visit (INDEPENDENT_AMBULATORY_CARE_PROVIDER_SITE_OTHER): Payer: Medicaid Other | Admitting: Advanced Practice Midwife

## 2021-07-11 VITALS — BP 125/74 | HR 104 | Wt 211.0 lb

## 2021-07-11 DIAGNOSIS — O2342 Unspecified infection of urinary tract in pregnancy, second trimester: Secondary | ICD-10-CM

## 2021-07-11 DIAGNOSIS — Z3A20 20 weeks gestation of pregnancy: Secondary | ICD-10-CM

## 2021-07-11 DIAGNOSIS — Z3401 Encounter for supervision of normal first pregnancy, first trimester: Secondary | ICD-10-CM

## 2021-07-11 LAB — OB RESULTS CONSOLE GBS: GBS: POSITIVE

## 2021-07-11 NOTE — Patient Instructions (Signed)
?  Considering Waterbirth? ?Guide for patients at [facility] ([facility]) ?Why consider waterbirth? ?Gentle birth for babies  ?Less pain medicine used in labor  ?May allow for passive descent/less pushing  ?May reduce perineal tears  ?More mobility and instinctive maternal position changes  ?Increased maternal relaxation  ? ?Is waterbirth safe? What are the risks of infection, drowning or other complications? ?Infection:  ?Very low risk (3.7 % for tub vs 4.8% for bed)  ?7 in 8000 waterbirths with documented infection  ?Poorly cleaned equipment most common cause  ?Slightly lower group B strep transmission rate  ?Drowning  ?Maternal:  ?Very low risk  ?Related to seizures or fainting  ?Newborn:  ?Very low risk. No evidence of increased risk of respiratory problems in multiple large studies  ?Physiological protection from breathing under water  ?Avoid underwater birth if there are any fetal complications  ?Once baby's head is out of the water, keep it out.  ?Birth complication  ?Some reports of cord trauma, but risk decreased by bringing baby to surface gradually  ?No evidence of increased risk of shoulder dystocia. Mothers can usually change positions faster in water than in a bed, possibly aiding the maneuvers to free the shoulder.  ? ?There are 2 things you MUST do to have a waterbirth with [facility]: ?Attend a waterbirth class at [facility] [place]   ?3rd Wednesday of every month from 7-9 pm (virtual during COVID) ?Free ?Register online at [email or web address] or [email or web address] or by calling [phone] ?Bring us the certificate from the class to your prenatal appointment or send via MyChart ?Meet with a midwife at 36 weeks* to see if you can still plan a waterbirth and to sign the consent.  ? ?*We also recommend that you schedule as many of your prenatal visits with a midwife as possible.   ? ?Helpful information: ?You may want to bring a bathing suit top to the hospital  to wear during labor but this is optional.  All other supplies are provided by the hospital. ?Please arrive at the hospital with signs of active labor, and do not wait at home until late in labor. It takes 45 min- 2 hours for COVID testing, fetal monitoring, and check in to your room to take place, plus transport and filling of the waterbirth tub.   ? ?Things that would prevent you from having a waterbirth: ?Unknown or Positive COVID-19 diagnosis upon admission to hospital* ?Premature, <37wks  ?Previous cesarean birth  ?Presence of thick meconium-stained fluid  ?Multiple gestation (Twins, triplets, etc.)  ?Uncontrolled diabetes or gestational diabetes requiring medication  ?Hypertension diagnosed in pregnancy or preexisting hypertension (gestational hypertension, preeclampsia, or chronic hypertension) ?Fetal growth restriction (your baby measures less than 10th percentile on ultrasound) ?Heavy vaginal bleeding  ?Non-reassuring fetal heart rate  ?Active infection (MRSA, etc.). Group B Strep is NOT a contraindication for waterbirth.  ?If your labor has to be induced and induction method requires continuous monitoring of the baby's heart rate  ?Other risks/issues identified by your obstetrical provider  ? ?Please remember that birth is unpredictable. Under certain unforeseeable circumstances your provider may advise against giving birth in the tub. These decisions will be made on a case-by-case basis and with the safety of you and your baby as our highest priority. ? ? ?*Please remember that in order to have a waterbirth, you must test Negative to COVID-19 upon admission to the hospital. ? ?Updated 03/07/21 ? ?

## 2021-07-11 NOTE — Progress Notes (Signed)
Pt had recent visit to MAU for increase in BP and numbness in hands.

## 2021-07-11 NOTE — Progress Notes (Signed)
   PRENATAL VISIT NOTE  Subjective:  Pamela Cisneros is a 20 y.o. G1P0 at [redacted]w[redacted]d being seen today for ongoing prenatal care.  She is currently monitored for the following issues for this low-risk pregnancy and has Encounter for supervision of normal first pregnancy in first trimester; Transient hypertension of pregnancy; and Anemia in pregnancy on their problem list.  Patient reports no complaints.  Contractions: Not present. Vag. Bleeding: None.  Movement: Present. Denies leaking of fluid.   The following portions of the patient's history were reviewed and updated as appropriate: allergies, current medications, past family history, past medical history, past social history, past surgical history and problem list.   Objective:   Vitals:   07/11/21 0901  BP: 125/74  Pulse: (!) 104  Weight: 211 lb (95.7 kg)    Fetal Status: Fetal Heart Rate (bpm): 150   Movement: Present     General:  Alert, oriented and cooperative. Patient is in no acute distress.  Skin: Skin is warm and dry. No rash noted.   Cardiovascular: Normal heart rate noted  Respiratory: Normal respiratory effort, no problems with respiration noted  Abdomen: Soft, gravid, appropriate for gestational age.  Pain/Pressure: Absent     Pelvic: Cervical exam deferred        Extremities: Normal range of motion.     Mental Status: Normal mood and affect. Normal behavior. Normal judgment and thought content.   Assessment and Plan:  Pregnancy: G1P0 at [redacted]w[redacted]d 1. Urinary tract infection in mother during second trimester of pregnancy --TOC today  2. Encounter for supervision of normal first pregnancy in first trimester --Anticipatory guidance about next visits/weeks of pregnancy given. --Discussed physiologic changes of pregnancy,questions answered --Next visit in 4 weeks --Pt to enroll in WB class as desired, no barriers at this time --Transient HTN at MAU visit with arm pain, no dx at this time, discussed contraindications to WB  including HTN  3. [redacted] weeks gestation of pregnancy   Preterm labor symptoms and general obstetric precautions including but not limited to vaginal bleeding, contractions, leaking of fluid and fetal movement were reviewed in detail with the patient. Please refer to After Visit Summary for other counseling recommendations.   No follow-ups on file.  Future Appointments  Date Time Provider Department Center  08/02/2021  7:30 AM [facility] NURSE [facility]  08/02/2021  7:45 AM [facility] US5 [facility]    Sharen Counter, CNM

## 2021-07-15 LAB — URINE CULTURE, OB REFLEX

## 2021-07-15 LAB — CULTURE, OB URINE

## 2021-07-18 ENCOUNTER — Encounter: Payer: Self-pay | Admitting: Advanced Practice Midwife

## 2021-07-18 DIAGNOSIS — B951 Streptococcus, group B, as the cause of diseases classified elsewhere: Secondary | ICD-10-CM | POA: Insufficient documentation

## 2021-07-18 DIAGNOSIS — O2342 Unspecified infection of urinary tract in pregnancy, second trimester: Secondary | ICD-10-CM | POA: Insufficient documentation

## 2021-08-02 ENCOUNTER — Encounter: Payer: Self-pay | Admitting: *Deleted

## 2021-08-02 ENCOUNTER — Ambulatory Visit: Payer: Medicaid Other | Admitting: *Deleted

## 2021-08-02 ENCOUNTER — Other Ambulatory Visit: Payer: Self-pay | Admitting: *Deleted

## 2021-08-02 ENCOUNTER — Other Ambulatory Visit: Payer: Self-pay

## 2021-08-02 ENCOUNTER — Ambulatory Visit: Payer: Medicaid Other | Attending: Obstetrics

## 2021-08-02 VITALS — BP 133/70 | HR 88

## 2021-08-02 DIAGNOSIS — B951 Streptococcus, group B, as the cause of diseases classified elsewhere: Secondary | ICD-10-CM | POA: Insufficient documentation

## 2021-08-02 DIAGNOSIS — Z362 Encounter for other antenatal screening follow-up: Secondary | ICD-10-CM

## 2021-08-02 DIAGNOSIS — E669 Obesity, unspecified: Secondary | ICD-10-CM | POA: Diagnosis not present

## 2021-08-02 DIAGNOSIS — O2342 Unspecified infection of urinary tract in pregnancy, second trimester: Secondary | ICD-10-CM | POA: Insufficient documentation

## 2021-08-02 DIAGNOSIS — O99212 Obesity complicating pregnancy, second trimester: Secondary | ICD-10-CM | POA: Diagnosis present

## 2021-08-02 DIAGNOSIS — Z3A23 23 weeks gestation of pregnancy: Secondary | ICD-10-CM | POA: Diagnosis not present

## 2021-08-02 DIAGNOSIS — Z3401 Encounter for supervision of normal first pregnancy, first trimester: Secondary | ICD-10-CM | POA: Insufficient documentation

## 2021-08-02 DIAGNOSIS — Z6835 Body mass index (BMI) 35.0-35.9, adult: Secondary | ICD-10-CM

## 2021-08-08 ENCOUNTER — Other Ambulatory Visit: Payer: Self-pay

## 2021-08-08 ENCOUNTER — Ambulatory Visit (INDEPENDENT_AMBULATORY_CARE_PROVIDER_SITE_OTHER): Payer: Medicaid Other | Admitting: Advanced Practice Midwife

## 2021-08-08 VITALS — BP 123/77 | HR 100 | Wt 215.0 lb

## 2021-08-08 DIAGNOSIS — Z3A24 24 weeks gestation of pregnancy: Secondary | ICD-10-CM

## 2021-08-08 DIAGNOSIS — Z3401 Encounter for supervision of normal first pregnancy, first trimester: Secondary | ICD-10-CM

## 2021-08-08 NOTE — Progress Notes (Signed)
   PRENATAL VISIT NOTE  Subjective:  Pamela Cisneros is a 20 y.o. G1P0 at [redacted]w[redacted]d being seen today for ongoing prenatal care.  She is currently monitored for the following issues for this low-risk pregnancy and has Encounter for supervision of normal first pregnancy in first trimester; Transient hypertension of pregnancy; Anemia in pregnancy; and GBS (group b Streptococcus) UTI complicating pregnancy, second trimester on their problem list.  Patient reports no complaints.  Contractions: Not present. Vag. Bleeding: None.  Movement: Present. Denies leaking of fluid.   The following portions of the patient's history were reviewed and updated as appropriate: allergies, current medications, past family history, past medical history, past social history, past surgical history and problem list.   Objective:   Vitals:   08/08/21 0831  BP: 123/77  Pulse: 100  Weight: 215 lb (97.5 kg)    Fetal Status: Fetal Heart Rate (bpm): 145   Movement: Present     General:  Alert, oriented and cooperative. Patient is in no acute distress.  Skin: Skin is warm and dry. No rash noted.   Cardiovascular: Normal heart rate noted  Respiratory: Normal respiratory effort, no problems with respiration noted  Abdomen: Soft, gravid, appropriate for gestational age.  Pain/Pressure: Absent     Pelvic: Cervical exam deferred        Extremities: Normal range of motion.     Mental Status: Normal mood and affect. Normal behavior. Normal judgment and thought content.   Assessment and Plan:  Pregnancy: G1P0 at [redacted]w[redacted]d 1. Encounter for supervision of normal first pregnancy in first trimester --Anticipatory guidance about next visits/weeks of pregnancy given. --Interested in waterbirth, questions answered. Pt enrolled in class, plan to see midwives for most visits. Discussed waterbirth as option for low risk pregnancy. Pregnancy is hard to predict and things may arise that prevent immersion in water but labor can be supported with  freedom of movement, birthing ball, shower and birth can be supported in position of pt choosing, etc, even if waterbirth becomes unavailable. Pt states understanding.  --Next visit in 4 weeks for GTT  2. [redacted] weeks gestation of pregnancy   Preterm labor symptoms and general obstetric precautions including but not limited to vaginal bleeding, contractions, leaking of fluid and fetal movement were reviewed in detail with the patient. Please refer to After Visit Summary for other counseling recommendations.   No follow-ups on file.  Future Appointments  Date Time Provider Department Center  10/04/2021  7:45 AM [facility] NURSE [facility]  10/04/2021  8:00 AM [facility] US1 [facility]    Sharen Counter, CNM

## 2021-09-06 ENCOUNTER — Ambulatory Visit (INDEPENDENT_AMBULATORY_CARE_PROVIDER_SITE_OTHER): Payer: Medicaid Other

## 2021-09-06 ENCOUNTER — Other Ambulatory Visit: Payer: Medicaid Other

## 2021-09-06 ENCOUNTER — Other Ambulatory Visit: Payer: Self-pay

## 2021-09-06 VITALS — BP 133/80 | HR 108 | Wt 219.0 lb

## 2021-09-06 DIAGNOSIS — Z23 Encounter for immunization: Secondary | ICD-10-CM

## 2021-09-06 DIAGNOSIS — Z3401 Encounter for supervision of normal first pregnancy, first trimester: Secondary | ICD-10-CM

## 2021-09-06 DIAGNOSIS — Z3A28 28 weeks gestation of pregnancy: Secondary | ICD-10-CM

## 2021-09-06 DIAGNOSIS — Z3402 Encounter for supervision of normal first pregnancy, second trimester: Secondary | ICD-10-CM

## 2021-09-06 NOTE — Progress Notes (Addendum)
ROB/GTT.  Declined FLU vaccine. TDAP given in LD, tolerated well.

## 2021-09-06 NOTE — Progress Notes (Signed)
   PRENATAL VISIT NOTE  Subjective:  Pamela Cisneros is a 20 y.o. G1P0 at [redacted]w[redacted]d being seen today for ongoing prenatal care.  She is currently monitored for the following issues for this low-risk pregnancy and has Encounter for supervision of normal first pregnancy in first trimester; Transient hypertension of pregnancy; Anemia in pregnancy; and GBS (group b Streptococcus) UTI complicating pregnancy, second trimester on their problem list.  Patient reports no complaints. Some mild discomfort while sleeping occasionally, but using pregnancy pillow helps.  Contractions: Not present. Vag. Bleeding: None.  Movement: Present. Denies leaking of fluid.   The following portions of the patient's history were reviewed and updated as appropriate: allergies, current medications, past family history, past medical history, past social history, past surgical history and problem list.   Objective:   Vitals:   09/06/21 0833  BP: 133/80  Pulse: (!) 108  Weight: 219 lb (99.3 kg)    Fetal Status: Fetal Heart Rate (bpm): 153 Fundal Height: 28 cm Movement: Present     General:  Alert, oriented and cooperative. Patient is in no acute distress.  Skin: Skin is warm and dry. No rash noted.   Cardiovascular: Normal heart rate noted  Respiratory: Normal respiratory effort, no problems with respiration noted  Abdomen: Soft, gravid, appropriate for gestational age.  Pain/Pressure: Present     Pelvic: Cervical exam deferred        Extremities: Normal range of motion.     Mental Status: Normal mood and affect. Normal behavior. Normal judgment and thought content.   Assessment and Plan:  Pregnancy: G1P0 at [redacted]w[redacted]d 1. Encounter for supervision of normal first pregnancy in first trimester - Routine OB care with GTT and labs today. Doing well. No concerns today - Remains interested in waterbirth. Completed waterbirth course. Instructed to bring certificate to be scanned into chart.  - Reviewed and reiterated circumstances  that could prevent birthing in tub, including maternal and fetal contraindications. Patient verbalized understanding - Anticipatory guidance for upcoming appointments provided  - Glucose Tolerance, 2 Hours w/1 Hour - RPR - CBC - HIV antibody (with reflex) - Tdap vaccine greater than or equal to 7yo IM  2. [redacted] weeks gestation of pregnancy    Preterm labor symptoms and general obstetric precautions including but not limited to vaginal bleeding, contractions, leaking of fluid and fetal movement were reviewed in detail with the patient. Please refer to After Visit Summary for other counseling recommendations.   Return in about 2 weeks (around 09/20/2021).  Future Appointments  Date Time Provider Department Center  09/20/2021  9:35 AM Hurshel Party, CNM [facility] None  10/04/2021  7:45 AM [facility] NURSE [facility]  10/04/2021  8:00 AM [facility]1 [facility]     Brand Males, CNM 09/06/21 11:46 AM

## 2021-09-07 ENCOUNTER — Other Ambulatory Visit: Payer: Self-pay

## 2021-09-07 DIAGNOSIS — O99019 Anemia complicating pregnancy, unspecified trimester: Secondary | ICD-10-CM

## 2021-09-07 LAB — CBC
Hematocrit: 30.9 % — ABNORMAL LOW (ref 34.0–46.6)
Hemoglobin: 9.2 g/dL — ABNORMAL LOW (ref 11.1–15.9)
MCH: 21.4 pg — ABNORMAL LOW (ref 26.6–33.0)
MCHC: 29.8 g/dL — ABNORMAL LOW (ref 31.5–35.7)
MCV: 72 fL — ABNORMAL LOW (ref 79–97)
Platelets: 529 10*3/uL — ABNORMAL HIGH (ref 150–450)
RBC: 4.29 x10E6/uL (ref 3.77–5.28)
RDW: 17.2 % — ABNORMAL HIGH (ref 11.7–15.4)
WBC: 14.6 10*3/uL — ABNORMAL HIGH (ref 3.4–10.8)

## 2021-09-07 LAB — HIV ANTIBODY (ROUTINE TESTING W REFLEX): HIV Screen 4th Generation wRfx: NONREACTIVE

## 2021-09-07 LAB — GLUCOSE TOLERANCE, 2 HOURS W/ 1HR
Glucose, 1 hour: 150 mg/dL (ref 70–179)
Glucose, 2 hour: 102 mg/dL (ref 70–152)
Glucose, Fasting: 89 mg/dL (ref 70–91)

## 2021-09-07 LAB — RPR: RPR Ser Ql: NONREACTIVE

## 2021-09-07 MED ORDER — FERROUS SULFATE 325 (65 FE) MG PO TABS: 325.0000 mg | ORAL_TABLET | ORAL | 5 refills | Status: AC

## 2021-09-20 ENCOUNTER — Encounter: Payer: Medicaid Other | Admitting: Advanced Practice Midwife

## 2021-09-20 NOTE — Progress Notes (Deleted)
   PRENATAL VISIT NOTE  Subjective:  Pamela Cisneros is a 20 y.o. G1P0 at [redacted]w[redacted]d being seen today for ongoing prenatal care.  She is currently monitored for the following issues for this {Blank single:19197::"high-risk","low-risk"} pregnancy and has Encounter for supervision of normal first pregnancy in first trimester; Transient hypertension of pregnancy; Anemia in pregnancy; and GBS (group b Streptococcus) UTI complicating pregnancy, second trimester on their problem list.  Patient reports {sx:14538}.   .  .   . Denies leaking of fluid.   The following portions of the patient's history were reviewed and updated as appropriate: allergies, current medications, past family history, past medical history, past social history, past surgical history and problem list.   Objective:  There were no vitals filed for this visit.  Fetal Status:           General:  Alert, oriented and cooperative. Patient is in no acute distress.  Skin: Skin is warm and dry. No rash noted.   Cardiovascular: Normal heart rate noted  Respiratory: Normal respiratory effort, no problems with respiration noted  Abdomen: Soft, gravid, appropriate for gestational age.        Pelvic: {Blank single:19197::"Cervical exam performed in the presence of a chaperone","Cervical exam deferred"}        Extremities: Normal range of motion.     Mental Status: Normal mood and affect. Normal behavior. Normal judgment and thought content.   Assessment and Plan:  Pregnancy: G1P0 at [redacted]w[redacted]d 1. Encounter for supervision of normal first pregnancy in first trimester ***  2. [redacted] weeks gestation of pregnancy ***  {Blank single:19197::"Term","Preterm"} labor symptoms and general obstetric precautions including but not limited to vaginal bleeding, contractions, leaking of fluid and fetal movement were reviewed in detail with the patient. Please refer to After Visit Summary for other counseling recommendations.   No follow-ups on file.  Future  Appointments  Date Time Provider Department Center  09/20/2021  9:35 AM Hurshel Party, CNM [facility] None  10/04/2021  7:45 AM [facility]URSE [facility]  10/04/2021  8:00 AM [facility] US1 [facility]    Sharen Counter, CNM

## 2021-09-28 ENCOUNTER — Other Ambulatory Visit: Payer: Self-pay

## 2021-09-28 ENCOUNTER — Ambulatory Visit (INDEPENDENT_AMBULATORY_CARE_PROVIDER_SITE_OTHER): Payer: Medicaid Other | Admitting: Women's Health

## 2021-09-28 VITALS — BP 129/85 | HR 90 | Wt 220.0 lb

## 2021-09-28 DIAGNOSIS — Z3A31 31 weeks gestation of pregnancy: Secondary | ICD-10-CM

## 2021-09-28 DIAGNOSIS — B951 Streptococcus, group B, as the cause of diseases classified elsewhere: Secondary | ICD-10-CM

## 2021-09-28 DIAGNOSIS — Z3401 Encounter for supervision of normal first pregnancy, first trimester: Secondary | ICD-10-CM

## 2021-09-28 DIAGNOSIS — O133 Gestational [pregnancy-induced] hypertension without significant proteinuria, third trimester: Secondary | ICD-10-CM

## 2021-09-28 DIAGNOSIS — O99013 Anemia complicating pregnancy, third trimester: Secondary | ICD-10-CM

## 2021-09-28 DIAGNOSIS — O2342 Unspecified infection of urinary tract in pregnancy, second trimester: Secondary | ICD-10-CM

## 2021-09-28 NOTE — Patient Instructions (Addendum)
Maternity Assessment Unit (MAU)  The Maternity Assessment Unit (MAU) is located at the [facility] and [place] at [facility]. The address is: [address] [place], [place], [place] [address]. Please see map below for additional directions.    The Maternity Assessment Unit is designed to help you during your pregnancy, and for up to 6 weeks after delivery, with any pregnancy- or postpartum-related emergencies, if you think you are in labor, or if your water has broken. For example, if you experience nausea and vomiting, vaginal bleeding, severe abdominal or pelvic pain, elevated blood pressure or other problems related to your pregnancy or postpartum time, please come to the Maternity Assessment Unit for assistance.       Third Trimester of Pregnancy The third trimester of pregnancy is from week 28 through week 35. This is months 7 through 9. The third trimester is a time when the unborn baby (fetus) is growing rapidly. At the end of the ninth month, the fetus is about 20 inches long and weighs 6-10 pounds. Body changes during your third trimester During the third trimester, your body will continue to go through many changes. The changes vary and generally return to normal after your baby is born. Physical changes Your weight will continue to increase. You can expect to gain 25-35 pounds (11-16 kg) by the end of the pregnancy if you begin pregnancy at a normal weight. If you are underweight, you can expect to gain 28-40 lb (about 13-18 kg), and if you are overweight, you can expect to gain 15-25 lb (about 7-11 kg). You may begin to get stretch marks on your hips, abdomen, and breasts. Your breasts will continue to grow and may hurt. A yellow fluid (colostrum) may leak from your breasts. This is the first milk you are producing for your baby. You may have changes in your hair. These can include thickening of your hair, rapid growth, and changes in texture. Some people also  have hair loss during or after pregnancy, or hair that feels dry or thin. Your belly button may stick out. You may notice more swelling in your hands, face, or ankles. Health changes You may have heartburn. You may have constipation. You may develop hemorrhoids. You may develop swollen, bulging veins (varicose veins) in your legs. You may have increased body aches in the pelvis, back, or thighs. This is due to weight gain and increased hormones that are relaxing your joints. You may have increased tingling or numbness in your hands, arms, and legs. The skin on your abdomen may also feel numb. You may feel short of breath because of your expanding uterus. Other changes You may urinate more often because the fetus is moving lower into your pelvis and pressing on your bladder. You may have more problems sleeping. This may be caused by the size of your abdomen, an increased need to urinate, and an increase in your body's metabolism. You may notice the fetus "dropping," or moving lower in your abdomen (lightening). You may have increased vaginal discharge. You may notice that you have pain around your pelvic bone as your uterus distends. Follow these instructions at home: Medicines Follow your health care provider's instructions regarding medicine use. Specific medicines may be either safe or unsafe to take during pregnancy. Do not take any medicines unless approved by your health care provider. Take a prenatal vitamin that contains at least 600 micrograms (mcg) of folic acid. Eating and drinking Eat a healthy diet that includes fresh fruits  and vegetables, whole grains, good sources of protein such as meat, eggs, or tofu, and low-fat dairy products. Avoid raw meat and unpasteurized juice, milk, and cheese. These carry germs that can harm you and your baby. Eat 4 or 5 small meals rather than 3 large meals a day. You may need to take these actions to prevent or treat constipation: Drink enough  fluid to keep your urine pale yellow. Eat foods that are high in fiber, such as beans, whole grains, and fresh fruits and vegetables. Limit foods that are high in fat and processed sugars, such as fried or sweet foods. Activity Exercise only as directed by your health care provider. Most people can continue their usual exercise routine during pregnancy. Try to exercise for 30 minutes at least 5 days a week. Stop exercising if you experience contractions in the uterus. Stop exercising if you develop pain or cramping in the lower abdomen or lower back. Avoid heavy lifting. Do not exercise if it is very hot or humid or if you are at a high altitude. If you choose to, you may continue to have sex unless your health care provider tells you not to. Relieving pain and discomfort Take frequent breaks and rest with your legs raised (elevated) if you have leg cramps or low back pain. Take warm sitz baths to soothe any pain or discomfort caused by hemorrhoids. Use hemorrhoid cream if your health care provider approves. Wear a supportive bra to prevent discomfort from breast tenderness. If you develop varicose veins: Wear support hose as told by your health care provider. Elevate your feet for 15 minutes, 3-4 times a day. Limit salt in your diet. Safety Talk to your health care provider before traveling far distances. Do not use hot tubs, steam rooms, or saunas. Wear your seat belt at all times when driving or riding in a car. Talk with your health care provider if someone is verbally or physically abusive to you. Preparing for birth To prepare for the arrival of your baby: Take prenatal classes to understand, practice, and ask questions about labor and delivery. Visit the hospital and tour the maternity area. Purchase a rear-facing car seat and make sure you know how to install it in your car. Prepare the baby's room or sleeping area. Make sure to remove all pillows and stuffed animals from the  baby's crib to prevent suffocation. General instructions Avoid cat litter boxes and soil used by cats. These carry germs that can cause birth defects in the baby. If you have a cat, ask someone to clean the litter box for you. Do not douche or use tampons. Do not use scented sanitary pads. Do not use any products that contain nicotine or tobacco, such as cigarettes, e-cigarettes, and chewing tobacco. If you need help quitting, ask your health care provider. Do not use any herbal remedies, illegal drugs, or medicines that were not prescribed to you. Chemicals in these products can harm your baby. Do not drink alcohol. You will have more frequent prenatal exams during the third trimester. During a routine prenatal visit, your health care provider will do a physical exam, perform tests, and discuss your overall health. Keep all follow-up visits. This is important. Where to find more information American Pregnancy Association: americanpregnancy.[place] and Gynecologists: PoolDevices.com.pt Office on [employer] Health: KeywordPortfolios.com.br Contact a health care provider if you have: A fever. Mild pelvic cramps, pelvic pressure, or nagging pain in your abdominal area or lower back. Vomiting or diarrhea. Bad-smelling  vaginal discharge or foul-smelling urine. Pain when you urinate. A headache that does not go away when you take medicine. Visual changes or see spots in front of your eyes. Get help right away if: Your water breaks. You have regular contractions less than 5 minutes apart. You have spotting or bleeding from your vagina. You have severe abdominal pain. You have difficulty breathing. You have chest pain. You have fainting spells. You have not felt your baby move for the time period told by your health care provider. You have new or increased pain, swelling, or redness in an arm or leg. Summary The third trimester of pregnancy is  from week 28 through week 40 (months 7 through 9). You may have more problems sleeping. This can be caused by the size of your abdomen, an increased need to urinate, and an increase in your body's metabolism. You will have more frequent prenatal exams during the third trimester. Keep all follow-up visits. This is important. This information is not intended to replace advice given to you by your health care provider. Make sure you discuss any questions you have with your health care provider. Document Revised: 04/21/2020 Document Reviewed: 02/26/2020 Elsevier Patient Education  2022 Elsevier Inc.       Pregnancy and Anemia Anemia is a condition in which there is not enough red blood cells or hemoglobin in the blood. Hemoglobin is a substance in red blood cells that carries oxygen. When you do not have enough red blood cells or hemoglobin (are anemic), your body cannot get enough oxygen and your organs may not work properly. Anemia is common during pregnancy because your body needs more blood volume and blood cells to provide nutrition to the unborn baby. What are the causes? The most common cause of anemia during pregnancy is not having enough iron in the body to make red blood cells (iron deficiency anemia). Other causes may include: Folic acid deficiency. Vitamin B12 deficiency. Certain prescription or over-the-counter medicines. Certain medical conditions or infections that destroy red blood cells. A low platelet count and bleeding caused by antibodies that go through the placenta to the baby from the mother's blood. What are the signs or symptoms? Mild anemia may not cause any symptoms. If anemia becomes severe, symptoms may include: Feeling tired or weak. Shortness of breath, especially during activity. Fainting. Pale skin. Headaches. A fast or irregular heartbeat. Dizziness. How is this diagnosed? This condition may be diagnosed based on your medical history and a physical exam.  You may also have blood tests. How is this treated? Treatment for anemia during pregnancy depends on the cause of the anemia. Treatment may include: Making changes to your diet. Taking iron, vitamin B12, or folic acid supplements. Having a blood transfusion. This may be needed if the anemia is severe. Follow these instructions at home: Eating and drinking Follow recommendations from your health care provider about changing your diet. Eat a diet rich in iron. This would include foods such as: Liver. Beef. Eggs. Whole grains. Spinach. Dried fruit. Increase your vitamin C intake. This will help the stomach absorb more iron. Some foods that are high in vitamin C include: Oranges. Peppers. Tomatoes. Mangoes. Eat green leafy vegetables. These are a good source of folic acid. General instructions Take iron supplements and vitamins as told by your health care provider. Keep all follow-up visits. This is important. Contact a health care provider if: You have headaches that happen often or do not go away. You bruise easily. You have a  fever. You have nausea and vomiting for more than 24 hours. You are unable to take supplements prescribed to treat your anemia. Get help right away if: You develop signs or symptoms of severe anemia. You have bleeding from your vagina. You develop a rash. You have bloody or tarry stools. You are very dizzy or you faint. Summary Anemia is a condition in which there is not enough red blood cells or hemoglobin in the blood. The most common cause of anemia during pregnancy is not having enough iron in the body to make red blood cells (iron deficiency anemia). Mild anemia may not cause any symptoms. If it becomes severe, symptoms may include feeling tired and weak. Take iron supplements and vitamins as told by your health care provider. Keep all follow-up visits. This is important. This information is not intended to replace advice given to you by your  health care provider. Make sure you discuss any questions you have with your health care provider. Document Revised: 03/16/2020 Document Reviewed: 03/16/2020 Elsevier Patient Education  2022 Elsevier Inc.       Childbirth Education Options: [facility] Department Classes:  Childbirth education classes can help you get ready for a positive parenting experience. You can also meet other expectant parents and get free stuff for your baby. Each class runs for five weeks on the same night and costs $45 for the mother-to-be and her support person. Medicaid covers the cost if you are eligible. Call [phone] to register. [facility] Childbirth Education: Classes can vary in availability and schedule is subject to change. For most up-to-date information please visit www.conehealthybaby.com to review and register.             Preterm Labor The normal length of a pregnancy is 39-41 weeks. Preterm labor is when labor starts before 37 completed weeks of pregnancy. Babies who are born prematurely and survive may not be fully developed and may be at an increased risk for long-term problems such as cerebral palsy, developmental delays, and vision and hearing problems. Babies who are born too early may have problems soon after birth. Premature babies may have problems regulating blood sugar, body temperature, heart rate, and breathing rate. These babies often have trouble with feeding. The risk of having problems is highest for babies who are born before 34 weeks of pregnancy. What are the causes? The exact cause of this condition is not known. What increases the risk? You are more likely to have preterm labor if you have certain risk factors that relate to your medical history, problems with present and past pregnancies, and lifestyle factors. Medical history You have abnormalities of the uterus, including a short cervix. You have STIs (sexually transmitted  infections) or other infections of the urinary tract and the vagina. You have chronic illnesses, such as blood clotting problems, diabetes, or high blood pressure. You are overweight or underweight. Present and past pregnancies You have had preterm labor before. You are pregnant with twins or other multiples. You have been diagnosed with a condition in which the placenta covers your cervix (placenta previa). You waited less than 18 months between giving birth and becoming pregnant again. Your unborn baby has some abnormalities. You have vaginal bleeding during pregnancy. You became pregnant through in vitro fertilization (IVF). Lifestyle and environmental factors You use tobacco products or drink alcohol. You use drugs. You have stress and no social support. You experience domestic violence. You are exposed to certain chemicals or environmental pollutants. Other factors You  are younger than age 5 or older than age 68. What are the signs or symptoms? Symptoms of this condition include: Cramps similar to those that can happen during a menstrual period. The cramps may happen with diarrhea. Pain in the abdomen or lower back. Regular contractions that may feel like tightening of the abdomen. A feeling of increased pressure in the pelvis. Increased watery or bloody mucus discharge from the vagina. Water breaking (ruptured amniotic sac). How is this diagnosed? This condition is diagnosed based on: Your medical history and a physical exam. A pelvic exam. An ultrasound. Monitoring your uterus for contractions. Other tests, including: A swab of the cervix to check for a chemical called fetal fibronectin. Urine tests. How is this treated? Treatment for this condition depends on the length of your pregnancy, your condition, and the health of your baby. Treatment may include: Taking medicines, such as: Hormone medicines. These may be given early in pregnancy to help support the  pregnancy. Medicines to stop contractions. Medicines to help mature the baby's lungs. These may be prescribed if the risk of delivery is high. Medicines to help protect your baby from brain and nerve complications such as cerebral palsy. Bed rest. If the labor happens before 34 weeks of pregnancy, you may need to stay in the hospital. Delivery of the baby. Follow these instructions at home:  Do not use any products that contain nicotine or tobacco. These products include cigarettes, chewing tobacco, and vaping devices, such as e-cigarettes. If you need help quitting, ask your health care provider. Do not drink alcohol. Take over-the-counter and prescription medicines only as told by your health care provider. Rest as told by your health care provider. Return to your normal activities as told by your health care provider. Ask your health care provider what activities are safe for you. Keep all follow-up visits. This is important. How is this prevented? To increase your chance of having a full-term pregnancy: Do not use drugs or take medicines that have not been prescribed to you during your pregnancy. Talk with your health care provider before taking any herbal supplements, even if you have been taking them regularly. Make sure you gain a healthy amount of weight during your pregnancy. Watch for infection. If you think that you might have an infection, get it checked right away. Symptoms of infection may include: Fever. Abnormal vaginal discharge or discharge that smells bad. Pain or burning with urination. Needing to urinate urgently. Frequently urinating or passing small amounts of urine frequently. Blood in your urine or urine that smells bad or unusual. Where to find more information U.S. Department of Health and Cytogeneticist on Women's Health: http://hoffman.com/ [employer] of Obstetricians and Gynecologists: www.acog.org Centers for Disease Control and  Prevention, Preterm Birth: FootballExhibition.com.br Contact a health care provider if: You think you are going into preterm labor. You have signs or symptoms of preterm labor. You have symptoms of infection. Get help right away if: You are having regular, painful contractions every 5 minutes or less. Your water breaks. Summary Preterm labor is labor that starts before you reach 37 weeks of pregnancy. Delivering your baby early increases your baby's risk of developing long-term problems. You are more likely to have preterm labor if you have certain risk factors that relate to your medical history, problems with present and past pregnancies, and lifestyle factors. Keep all follow-up visits. This is important. Contact a health care provider if you have signs or symptoms of preterm labor. This information  is not intended to replace advice given to you by your health care provider. Make sure you discuss any questions you have with your health care provider. Document Revised: 11/16/2020 Document Reviewed: 11/16/2020 Elsevier Patient Education  2022 [employer].

## 2021-09-28 NOTE — Progress Notes (Signed)
Subjective:  Pamela Cisneros is a 20 y.o. G1P0 at [redacted]w[redacted]d being seen today for ongoing prenatal care.  She is currently monitored for the following issues for this low-risk pregnancy and has Encounter for supervision of normal first pregnancy in first trimester; Transient hypertension of pregnancy; Anemia in pregnancy; and GBS (group b Streptococcus) UTI complicating pregnancy, second trimester on their problem list.  Patient reports no complaints.  Contractions: Not present. Vag. Bleeding: None.  Movement: Present. Denies leaking of fluid.   The following portions of the patient's history were reviewed and updated as appropriate: allergies, current medications, past family history, past medical history, past social history, past surgical history and problem list. Problem list updated.  Objective:   Vitals:   09/28/21 0829  BP: 129/85  Pulse: 90  Weight: 220 lb (99.8 kg)    Fetal Status: Fetal Heart Rate (bpm): 145 Fundal Height: 31 cm Movement: Present     General:  Alert, oriented and cooperative. Patient is in no acute distress.  Skin: Skin is warm and dry. No rash noted.   Cardiovascular: Normal heart rate noted  Respiratory: Normal respiratory effort, no problems with respiration noted  Abdomen: Soft, gravid, appropriate for gestational age. Pain/Pressure: Present     Pelvic: Vag. Bleeding: None     Cervical exam deferred        Extremities: Normal range of motion.  Edema: None  Mental Status: Normal mood and affect. Normal behavior. Normal judgment and thought content.   Urinalysis:      Assessment and Plan:  Pregnancy: G1P0 at [redacted]w[redacted]d  1. Encounter for supervision of normal first pregnancy in first trimester -CBE info given PHQ9 SCORE ONLY 09/06/2021 09/06/2021 04/20/2021  PHQ-9 Total Score 2 0 4   GAD 7 : Generalized Anxiety Score 04/20/2021  Nervous, Anxious, on Edge 1  Control/stop worrying 0  Worry too much - different things 1  Trouble relaxing 0  Restless 0  Easily  annoyed or irritable 1  Afraid - awful might happen 0  Total GAD 7 Score 3   2. GBS (group b Streptococcus) UTI complicating pregnancy, second trimester -treat in labor  3. Transient hypertension of pregnancy in third trimester -BP today 129/85  4. Anemia during pregnancy in third trimester CBC Latest Ref Rng & Units 09/06/2021 07/08/2021 05/16/2021  WBC 3.4 - 10.8 x10E3/uL 14.6(H) 14.6(H) 11.2(H)  Hemoglobin 11.1 - 15.9 g/dL 9.7(L) 8.9(Q) 1.1(H)  Hematocrit 34.0 - 46.6 % 30.9(L) 31.0(L) 31.9(L)  Platelets 150 - 450 x10E3/uL 529(H) 548(H) 555(H)  -pt on oral iron  5. [redacted] weeks gestation of pregnancy  Preterm labor symptoms and general obstetric precautions including but not limited to vaginal bleeding, contractions, leaking of fluid and fetal movement were reviewed in detail with the patient. I discussed the assessment and treatment plan with the patient. The patient was provided an opportunity to ask questions and all were answered. The patient agreed with the plan and demonstrated an understanding of the instructions. The patient was advised to call back or seek an in-person office evaluation/go to MAU at [facility] for any urgent or concerning symptoms. Please refer to After Visit Summary for other counseling recommendations.  Return in about 2 weeks (around 10/12/2021) for in-person LOB/MIDWIFE ONLY/Discuss WB.   Corissa Oguinn, Odie Sera, NP

## 2021-10-04 ENCOUNTER — Other Ambulatory Visit: Payer: Self-pay

## 2021-10-04 ENCOUNTER — Encounter: Payer: Self-pay | Admitting: *Deleted

## 2021-10-04 ENCOUNTER — Ambulatory Visit: Payer: Medicaid Other | Attending: Obstetrics and Gynecology

## 2021-10-04 ENCOUNTER — Ambulatory Visit: Payer: Medicaid Other | Admitting: *Deleted

## 2021-10-04 VITALS — BP 129/73 | HR 90

## 2021-10-04 DIAGNOSIS — O2342 Unspecified infection of urinary tract in pregnancy, second trimester: Secondary | ICD-10-CM

## 2021-10-04 DIAGNOSIS — Z3A32 32 weeks gestation of pregnancy: Secondary | ICD-10-CM | POA: Insufficient documentation

## 2021-10-04 DIAGNOSIS — Z362 Encounter for other antenatal screening follow-up: Secondary | ICD-10-CM | POA: Insufficient documentation

## 2021-10-04 DIAGNOSIS — O99212 Obesity complicating pregnancy, second trimester: Secondary | ICD-10-CM | POA: Diagnosis not present

## 2021-10-04 DIAGNOSIS — E669 Obesity, unspecified: Secondary | ICD-10-CM

## 2021-10-04 DIAGNOSIS — O133 Gestational [pregnancy-induced] hypertension without significant proteinuria, third trimester: Secondary | ICD-10-CM | POA: Insufficient documentation

## 2021-10-04 DIAGNOSIS — B951 Streptococcus, group B, as the cause of diseases classified elsewhere: Secondary | ICD-10-CM | POA: Insufficient documentation

## 2021-10-04 DIAGNOSIS — Z3401 Encounter for supervision of normal first pregnancy, first trimester: Secondary | ICD-10-CM | POA: Insufficient documentation

## 2021-10-04 DIAGNOSIS — Z6835 Body mass index (BMI) 35.0-35.9, adult: Secondary | ICD-10-CM

## 2021-10-04 DIAGNOSIS — O99213 Obesity complicating pregnancy, third trimester: Secondary | ICD-10-CM | POA: Diagnosis not present

## 2021-10-26 ENCOUNTER — Ambulatory Visit (INDEPENDENT_AMBULATORY_CARE_PROVIDER_SITE_OTHER): Payer: Medicaid Other | Admitting: Women's Health

## 2021-10-26 ENCOUNTER — Other Ambulatory Visit: Payer: Self-pay

## 2021-10-26 VITALS — BP 108/78 | HR 102 | Wt 227.0 lb

## 2021-10-26 DIAGNOSIS — B951 Streptococcus, group B, as the cause of diseases classified elsewhere: Secondary | ICD-10-CM

## 2021-10-26 DIAGNOSIS — Z3401 Encounter for supervision of normal first pregnancy, first trimester: Secondary | ICD-10-CM

## 2021-10-26 DIAGNOSIS — O2342 Unspecified infection of urinary tract in pregnancy, second trimester: Secondary | ICD-10-CM

## 2021-10-26 DIAGNOSIS — Z3A35 35 weeks gestation of pregnancy: Secondary | ICD-10-CM

## 2021-10-26 DIAGNOSIS — O133 Gestational [pregnancy-induced] hypertension without significant proteinuria, third trimester: Secondary | ICD-10-CM

## 2021-10-26 DIAGNOSIS — O99013 Anemia complicating pregnancy, third trimester: Secondary | ICD-10-CM

## 2021-10-26 NOTE — Patient Instructions (Signed)
Maternity Assessment Unit (MAU)  The Maternity Assessment Unit (MAU) is located at the [facility] and [place] at [facility]. The address is: [address] [place], [place], [place] [address]. Please see map below for additional directions.    The Maternity Assessment Unit is designed to help you during your pregnancy, and for up to 6 weeks after delivery, with any pregnancy- or postpartum-related emergencies, if you think you are in labor, or if your water has broken. For example, if you experience nausea and vomiting, vaginal bleeding, severe abdominal or pelvic pain, elevated blood pressure or other problems related to your pregnancy or postpartum time, please come to the Maternity Assessment Unit for assistance.       Third Trimester of Pregnancy The third trimester of pregnancy is from week 28 through week 35. This is months 7 through 9. The third trimester is a time when the unborn baby (fetus) is growing rapidly. At the end of the ninth month, the fetus is about 20 inches long and weighs 6-10 pounds. Body changes during your third trimester During the third trimester, your body will continue to go through many changes. The changes vary and generally return to normal after your baby is born. Physical changes Your weight will continue to increase. You can expect to gain 25-35 pounds (11-16 kg) by the end of the pregnancy if you begin pregnancy at a normal weight. If you are underweight, you can expect to gain 28-40 lb (about 13-18 kg), and if you are overweight, you can expect to gain 15-25 lb (about 7-11 kg). You may begin to get stretch marks on your hips, abdomen, and breasts. Your breasts will continue to grow and may hurt. A yellow fluid (colostrum) may leak from your breasts. This is the first milk you are producing for your baby. You may have changes in your hair. These can include thickening of your hair, rapid growth, and changes in texture. Some people also  have hair loss during or after pregnancy, or hair that feels dry or thin. Your belly button may stick out. You may notice more swelling in your hands, face, or ankles. Health changes You may have heartburn. You may have constipation. You may develop hemorrhoids. You may develop swollen, bulging veins (varicose veins) in your legs. You may have increased body aches in the pelvis, back, or thighs. This is due to weight gain and increased hormones that are relaxing your joints. You may have increased tingling or numbness in your hands, arms, and legs. The skin on your abdomen may also feel numb. You may feel short of breath because of your expanding uterus. Other changes You may urinate more often because the fetus is moving lower into your pelvis and pressing on your bladder. You may have more problems sleeping. This may be caused by the size of your abdomen, an increased need to urinate, and an increase in your body's metabolism. You may notice the fetus "dropping," or moving lower in your abdomen (lightening). You may have increased vaginal discharge. You may notice that you have pain around your pelvic bone as your uterus distends. Follow these instructions at home: Medicines Follow your health care provider's instructions regarding medicine use. Specific medicines may be either safe or unsafe to take during pregnancy. Do not take any medicines unless approved by your health care provider. Take a prenatal vitamin that contains at least 600 micrograms (mcg) of folic acid. Eating and drinking Eat a healthy diet that includes fresh fruits  and vegetables, whole grains, good sources of protein such as meat, eggs, or tofu, and low-fat dairy products. Avoid raw meat and unpasteurized juice, milk, and cheese. These carry germs that can harm you and your baby. Eat 4 or 5 small meals rather than 3 large meals a day. You may need to take these actions to prevent or treat constipation: Drink enough  fluid to keep your urine pale yellow. Eat foods that are high in fiber, such as beans, whole grains, and fresh fruits and vegetables. Limit foods that are high in fat and processed sugars, such as fried or sweet foods. Activity Exercise only as directed by your health care provider. Most people can continue their usual exercise routine during pregnancy. Try to exercise for 30 minutes at least 5 days a week. Stop exercising if you experience contractions in the uterus. Stop exercising if you develop pain or cramping in the lower abdomen or lower back. Avoid heavy lifting. Do not exercise if it is very hot or humid or if you are at a high altitude. If you choose to, you may continue to have sex unless your health care provider tells you not to. Relieving pain and discomfort Take frequent breaks and rest with your legs raised (elevated) if you have leg cramps or low back pain. Take warm sitz baths to soothe any pain or discomfort caused by hemorrhoids. Use hemorrhoid cream if your health care provider approves. Wear a supportive bra to prevent discomfort from breast tenderness. If you develop varicose veins: Wear support hose as told by your health care provider. Elevate your feet for 15 minutes, 3-4 times a day. Limit salt in your diet. Safety Talk to your health care provider before traveling far distances. Do not use hot tubs, steam rooms, or saunas. Wear your seat belt at all times when driving or riding in a car. Talk with your health care provider if someone is verbally or physically abusive to you. Preparing for birth To prepare for the arrival of your baby: Take prenatal classes to understand, practice, and ask questions about labor and delivery. Visit the hospital and tour the maternity area. Purchase a rear-facing car seat and make sure you know how to install it in your car. Prepare the baby's room or sleeping area. Make sure to remove all pillows and stuffed animals from the  baby's crib to prevent suffocation. General instructions Avoid cat litter boxes and soil used by cats. These carry germs that can cause birth defects in the baby. If you have a cat, ask someone to clean the litter box for you. Do not douche or use tampons. Do not use scented sanitary pads. Do not use any products that contain nicotine or tobacco, such as cigarettes, e-cigarettes, and chewing tobacco. If you need help quitting, ask your health care provider. Do not use any herbal remedies, illegal drugs, or medicines that were not prescribed to you. Chemicals in these products can harm your baby. Do not drink alcohol. You will have more frequent prenatal exams during the third trimester. During a routine prenatal visit, your health care provider will do a physical exam, perform tests, and discuss your overall health. Keep all follow-up visits. This is important. Where to find more information American Pregnancy Association: americanpregnancy.[place] and Gynecologists: PoolDevices.com.pt Office on [employer] Health: KeywordPortfolios.com.br Contact a health care provider if you have: A fever. Mild pelvic cramps, pelvic pressure, or nagging pain in your abdominal area or lower back. Vomiting or diarrhea. Bad-smelling  vaginal discharge or foul-smelling urine. Pain when you urinate. A headache that does not go away when you take medicine. Visual changes or see spots in front of your eyes. Get help right away if: Your water breaks. You have regular contractions less than 5 minutes apart. You have spotting or bleeding from your vagina. You have severe abdominal pain. You have difficulty breathing. You have chest pain. You have fainting spells. You have not felt your baby move for the time period told by your health care provider. You have new or increased pain, swelling, or redness in an arm or leg. Summary The third trimester of pregnancy is  from week 28 through week 40 (months 7 through 9). You may have more problems sleeping. This can be caused by the size of your abdomen, an increased need to urinate, and an increase in your body's metabolism. You will have more frequent prenatal exams during the third trimester. Keep all follow-up visits. This is important. This information is not intended to replace advice given to you by your health care provider. Make sure you discuss any questions you have with your health care provider. Document Revised: 04/21/2020 Document Reviewed: 02/26/2020 Elsevier Patient Education  2022 Elsevier Inc.       Preterm Labor The normal length of a pregnancy is 39-41 weeks. Preterm labor is when labor starts before 37 completed weeks of pregnancy. Babies who are born prematurely and survive may not be fully developed and may be at an increased risk for long-term problems such as cerebral palsy, developmental delays, and vision and hearing problems. Babies who are born too early may have problems soon after birth. Premature babies may have problems regulating blood sugar, body temperature, heart rate, and breathing rate. These babies often have trouble with feeding. The risk of having problems is highest for babies who are born before 34 weeks of pregnancy. What are the causes? The exact cause of this condition is not known. What increases the risk? You are more likely to have preterm labor if you have certain risk factors that relate to your medical history, problems with present and past pregnancies, and lifestyle factors. Medical history You have abnormalities of the uterus, including a short cervix. You have STIs (sexually transmitted infections) or other infections of the urinary tract and the vagina. You have chronic illnesses, such as blood clotting problems, diabetes, or high blood pressure. You are overweight or underweight. Present and past pregnancies You have had preterm labor before. You  are pregnant with twins or other multiples. You have been diagnosed with a condition in which the placenta covers your cervix (placenta previa). You waited less than 18 months between giving birth and becoming pregnant again. Your unborn baby has some abnormalities. You have vaginal bleeding during pregnancy. You became pregnant through in vitro fertilization (IVF). Lifestyle and environmental factors You use tobacco products or drink alcohol. You use drugs. You have stress and no social support. You experience domestic violence. You are exposed to certain chemicals or environmental pollutants. Other factors You are younger than age 68 or older than age 67. What are the signs or symptoms? Symptoms of this condition include: Cramps similar to those that can happen during a menstrual period. The cramps may happen with diarrhea. Pain in the abdomen or lower back. Regular contractions that may feel like tightening of the abdomen. A feeling of increased pressure in the pelvis. Increased watery or bloody mucus discharge from the vagina. Water breaking (ruptured amniotic sac). How is this  diagnosed? This condition is diagnosed based on: Your medical history and a physical exam. A pelvic exam. An ultrasound. Monitoring your uterus for contractions. Other tests, including: A swab of the cervix to check for a chemical called fetal fibronectin. Urine tests. How is this treated? Treatment for this condition depends on the length of your pregnancy, your condition, and the health of your baby. Treatment may include: Taking medicines, such as: Hormone medicines. These may be given early in pregnancy to help support the pregnancy. Medicines to stop contractions. Medicines to help mature the baby's lungs. These may be prescribed if the risk of delivery is high. Medicines to help protect your baby from brain and nerve complications such as cerebral palsy. Bed rest. If the labor happens before 34  weeks of pregnancy, you may need to stay in the hospital. Delivery of the baby. Follow these instructions at home:  Do not use any products that contain nicotine or tobacco. These products include cigarettes, chewing tobacco, and vaping devices, such as e-cigarettes. If you need help quitting, ask your health care provider. Do not drink alcohol. Take over-the-counter and prescription medicines only as told by your health care provider. Rest as told by your health care provider. Return to your normal activities as told by your health care provider. Ask your health care provider what activities are safe for you. Keep all follow-up visits. This is important. How is this prevented? To increase your chance of having a full-term pregnancy: Do not use drugs or take medicines that have not been prescribed to you during your pregnancy. Talk with your health care provider before taking any herbal supplements, even if you have been taking them regularly. Make sure you gain a healthy amount of weight during your pregnancy. Watch for infection. If you think that you might have an infection, get it checked right away. Symptoms of infection may include: Fever. Abnormal vaginal discharge or discharge that smells bad. Pain or burning with urination. Needing to urinate urgently. Frequently urinating or passing small amounts of urine frequently. Blood in your urine or urine that smells bad or unusual. Where to find more information [place].S. Department of Health and Cytogeneticist on Women's Health: http://hoffman.com/ [employer] of Obstetricians and Gynecologists: www.acog.org Centers for Disease Control and Prevention, Preterm Birth: FootballExhibition.com.br Contact a health care provider if: You think you are going into preterm labor. You have signs or symptoms of preterm labor. You have symptoms of infection. Get help right away if: You are having regular, painful contractions every 5 minutes or  less. Your water breaks. Summary Preterm labor is labor that starts before you reach 37 weeks of pregnancy. Delivering your baby early increases your baby's risk of developing long-term problems. You are more likely to have preterm labor if you have certain risk factors that relate to your medical history, problems with present and past pregnancies, and lifestyle factors. Keep all follow-up visits. This is important. Contact a health care provider if you have signs or symptoms of preterm labor. This information is not intended to replace advice given to you by your health care provider. Make sure you discuss any questions you have with your health care provider. Document Revised: 11/16/2020 Document Reviewed: 11/16/2020 Elsevier Patient Education  2022 Elsevier Inc.       Group B Streptococcus Infection During Pregnancy Group B Streptococcus (GBS) is a type of bacteria that is often found in healthy people. It is commonly found in the rectum, vagina, and intestines. In people  who are healthy and not pregnant, the bacteria rarely cause serious illness or complications. However, women who test positive for GBS during pregnancy can pass the bacteria to the baby during childbirth. This can cause serious infection in the baby after birth. Women with GBS may also have infections during their pregnancy or soon after childbirth. The infections include urinary tract infections (UTIs) or infections of the uterus. GBS also increases a woman's risk of complications during pregnancy, such as early labor or delivery, miscarriage, or stillbirth. Routine testing for GBS is recommended for all pregnant women. What are the causes? This condition is caused by bacteria called Streptococcus agalactiae. What increases the risk? You may have a higher risk for GBS infection during pregnancy if you had one during a past pregnancy. What are the signs or symptoms? In most cases, GBS infection does not cause symptoms  in pregnant women. If symptoms exist, they may include: Labor that starts before the 37th week of pregnancy. A UTI or bladder infection. This may cause a fever, frequent urination, or pain and burning during urination. Fever during labor. There can also be a rapid heartbeat in the mother or baby. Rare but serious symptoms of a GBS infection in women include: Blood infection (septicemia). This may cause fever, chills, or confusion. Lung infection (pneumonia). This may cause fever, chills, cough, rapid breathing, chest pain, or difficulty breathing. Bone, joint, skin, or soft tissue infection. How is this diagnosed? You may be screened for GBS between week 35 and week 37 of pregnancy. If you have symptoms of preterm labor, you may be screened earlier. This condition is diagnosed based on lab test results from: A swab of fluid from the vagina and rectum. A urine sample. How is this treated? This condition is treated with antibiotic medicine. Antibiotic medicine may be given: To you when you go into labor, or as soon as your water breaks. The medicines will continue until after you give birth. If you are having a cesarean delivery, you do not need antibiotics unless your water has broken. To your baby, if he or she requires treatment. Your health care provider will check your baby to decide if he or she needs antibiotics to prevent a serious infection. Follow these instructions at home: Take over-the-counter and prescription medicines only as told by your health care provider. Take your antibiotic medicine as told by your health care provider. Do not stop taking the antibiotic even if you start to feel better. Keep all pre-birth (prenatal) visits and follow-up visits as told by your health care provider. This is important. Contact a health care provider if: You have pain or burning when you urinate. You have to urinate more often than usual. You have a fever or chills. You develop a bad-smelling  vaginal discharge. Get help right away if: Your water breaks. You go into labor. You have severe pain in your abdomen. You have difficulty breathing. You have chest pain. These symptoms may represent a serious problem that is an emergency. Do not wait to see if the symptoms will go away. Get medical help right away. Call your local emergency services (911 in the [place] Do not drive yourself to the hospital. Summary GBS is a type of bacteria that is common in healthy people. During pregnancy, colonization with GBS can cause serious complications for you or your baby. Your health care provider will screen you between 35 and 37 weeks of pregnancy to determine if you are colonized with GBS. If you are colonized  with GBS during pregnancy, your health care provider will recommend antibiotics through an IV during labor. After delivery, your baby will be evaluated for complications related to potential GBS infection and may require antibiotics to prevent a serious infection. This information is not intended to replace advice given to you by your health care provider. Make sure you discuss any questions you have with your health care provider. Document Revised: 09/14/2020 Document Reviewed: 06/09/2019 Elsevier Patient Education  2022 [employer].

## 2021-10-26 NOTE — Progress Notes (Signed)
Subjective:  Pamela Cisneros is a 20 y.o. G1P0 at [redacted]w[redacted]d being seen today for ongoing prenatal care.  She is currently monitored for the following issues for this low-risk pregnancy and has Encounter for supervision of normal first pregnancy in first trimester; Transient hypertension of pregnancy; Anemia in pregnancy; and GBS (group b Streptococcus) UTI complicating pregnancy, second trimester on their problem list.  Patient reports no complaints.  Contractions: Irritability. Vag. Bleeding: None.  Movement: Present. Denies leaking of fluid.   The following portions of the patient's history were reviewed and updated as appropriate: allergies, current medications, past family history, past medical history, past social history, past surgical history and problem list. Problem list updated.  Objective:   Vitals:   10/26/21 1037  BP: 108/78  Pulse: (!) 102  Weight: 227 lb (103 kg)    Fetal Status: Fetal Heart Rate (bpm): 150 Fundal Height: 35 cm Movement: Present     General:  Alert, oriented and cooperative. Patient is in no acute distress.  Skin: Skin is warm and dry. No rash noted.   Cardiovascular: Normal heart rate noted  Respiratory: Normal respiratory effort, no problems with respiration noted  Abdomen: Soft, gravid, appropriate for gestational age. Pain/Pressure: Present     Pelvic: Vag. Bleeding: None     Cervical exam deferred        Extremities: Normal range of motion.  Edema: None  Mental Status: Normal mood and affect. Normal behavior. Normal judgment and thought content.   Urinalysis:      Assessment and Plan:  Pregnancy: G1P0 at [redacted]w[redacted]d  1. Encounter for supervision of normal first pregnancy in first trimester -discussed WB, pt still interested  Depression screen [facility] 2/9 09/06/2021 09/06/2021 04/20/2021  Decreased Interest 0 0 0  Down, Depressed, Hopeless 0 0 0  PHQ - 2 Score 0 0 0  Altered sleeping 1 - 1  Tired, decreased energy 1 - 1  Change in appetite 0 - 1  Feeling  bad or failure about yourself  0 - 0  Trouble concentrating 0 - 1  Moving slowly or fidgety/restless 0 - 0  Suicidal thoughts 0 - 0  PHQ-9 Score 2 - 4  Difficult doing work/chores Not difficult at all - -   GAD 7 : Generalized Anxiety Score 04/20/2021  Nervous, Anxious, on Edge 1  Control/stop worrying 0  Worry too much - different things 1  Trouble relaxing 0  Restless 0  Easily annoyed or irritable 1  Afraid - awful might happen 0  Total GAD 7 Score 3   2. Anemia during pregnancy in third trimester -on oral iron - CBC CBC Latest Ref Rng & Units 09/06/2021 07/08/2021 05/16/2021  WBC 3.4 - 10.8 x10E3/uL 14.6(H) 14.6(H) 11.2(H)  Hemoglobin 11.1 - 15.9 g/dL 3.1(D) 1.7(O) 1.6(W)  Hematocrit 34.0 - 46.6 % 30.9(L) 31.0(L) 31.9(L)  Platelets 150 - 450 x10E3/uL 529(H) 548(H) 555(H)    3. Transient hypertension of pregnancy in third trimester -BP today 108/78  4. GBS (group b Streptococcus) UTI complicating pregnancy, second trimester -PCN allergy  5. [redacted] weeks gestation of pregnancy  Preterm labor symptoms and general obstetric precautions including but not limited to vaginal bleeding, contractions, leaking of fluid and fetal movement were reviewed in detail with the patient. I discussed the assessment and treatment plan with the patient. The patient was provided an opportunity to ask questions and all were answered. The patient agreed with the plan and demonstrated an understanding of the instructions. The patient was advised to call  back or seek an in-person office evaluation/go to [facility]U at [facility] for any urgent or concerning symptoms. Please refer to After Visit Summary for other counseling recommendations.  Return in about 1 week (around 11/02/2021) for in-person LOB/MIDWIFE ONLY/Discuss WB.   Cully Luckow, Gerrie Nordmann, NP

## 2021-10-27 LAB — CBC
Hematocrit: 30.3 % — ABNORMAL LOW (ref 34.0–46.6)
Hemoglobin: 9.5 g/dL — ABNORMAL LOW (ref 11.1–15.9)
MCH: 21.5 pg — ABNORMAL LOW (ref 26.6–33.0)
MCHC: 31.4 g/dL — ABNORMAL LOW (ref 31.5–35.7)
MCV: 69 fL — ABNORMAL LOW (ref 79–97)
Platelets: 494 10*3/uL — ABNORMAL HIGH (ref 150–450)
RBC: 4.41 x10E6/uL (ref 3.77–5.28)
RDW: 16.9 % — ABNORMAL HIGH (ref 11.7–15.4)
WBC: 11.9 10*3/uL — ABNORMAL HIGH (ref 3.4–10.8)

## 2021-11-03 ENCOUNTER — Other Ambulatory Visit (HOSPITAL_COMMUNITY)
Admission: RE | Admit: 2021-11-03 | Discharge: 2021-11-03 | Disposition: A | Discharge status: Home / Self Care | Payer: Medicaid Other | Source: Ambulatory Visit

## 2021-11-03 ENCOUNTER — Ambulatory Visit (INDEPENDENT_AMBULATORY_CARE_PROVIDER_SITE_OTHER): Payer: Medicaid Other

## 2021-11-03 ENCOUNTER — Other Ambulatory Visit: Payer: Self-pay

## 2021-11-03 VITALS — BP 128/90 | HR 102 | Wt 224.0 lb

## 2021-11-03 DIAGNOSIS — Z3A36 36 weeks gestation of pregnancy: Secondary | ICD-10-CM

## 2021-11-03 DIAGNOSIS — Z3403 Encounter for supervision of normal first pregnancy, third trimester: Secondary | ICD-10-CM

## 2021-11-03 DIAGNOSIS — R8271 Bacteriuria: Secondary | ICD-10-CM

## 2021-11-03 DIAGNOSIS — O133 Gestational [pregnancy-induced] hypertension without significant proteinuria, third trimester: Secondary | ICD-10-CM

## 2021-11-03 LAB — COMPREHENSIVE METABOLIC PANEL
ALT: 20 IU/L (ref 0–32)
AST: 20 IU/L (ref 0–40)
Albumin/Globulin Ratio: 1.2 (ref 1.2–2.2)
Albumin: 3.7 g/dL — ABNORMAL LOW (ref 3.9–5.0)
Alkaline Phosphatase: 161 IU/L — ABNORMAL HIGH (ref 44–121)
BUN/Creatinine Ratio: 9 (ref 9–23)
BUN: 7 mg/dL (ref 6–20)
Bilirubin Total: 0.3 mg/dL (ref 0.0–1.2)
CO2: 19 mmol/L — ABNORMAL LOW (ref 20–29)
Calcium: 9.2 mg/dL (ref 8.7–10.2)
Chloride: 101 mmol/L (ref 96–106)
Creatinine, Ser: 0.75 mg/dL (ref 0.57–1.00)
Globulin, Total: 3 g/dL (ref 1.5–4.5)
Glucose: 102 mg/dL — ABNORMAL HIGH (ref 70–99)
Potassium: 4.7 mmol/L (ref 3.5–5.2)
Sodium: 137 mmol/L (ref 134–144)
Total Protein: 6.7 g/dL (ref 6.0–8.5)
eGFR: 116 mL/min/{1.73_m2} (ref >59)

## 2021-11-03 LAB — CBC
Hematocrit: 33.5 % — ABNORMAL LOW (ref 34.0–46.6)
Hemoglobin: 10.2 g/dL — ABNORMAL LOW (ref 11.1–15.9)
MCH: 21.5 pg — ABNORMAL LOW (ref 26.6–33.0)
MCHC: 30.4 g/dL — ABNORMAL LOW (ref 31.5–35.7)
MCV: 71 fL — ABNORMAL LOW (ref 79–97)
Platelets: 467 10*3/uL — ABNORMAL HIGH (ref 150–450)
RBC: 4.74 x10E6/uL (ref 3.77–5.28)
RDW: 17.3 % — ABNORMAL HIGH (ref 11.7–15.4)
WBC: 10.8 10*3/uL (ref 3.4–10.8)

## 2021-11-03 LAB — PROTEIN / CREATININE RATIO, URINE
Creatinine, Urine: 267.1 mg/dL
Protein, Ur: 34.2 mg/dL
Protein/Creat Ratio: 128 mg/g creat (ref 0–200)

## 2021-11-03 NOTE — Progress Notes (Signed)
Pt needs to discuss water birth and sign consent.

## 2021-11-03 NOTE — Progress Notes (Signed)
CMP, CBC, Protein/ Creatinine labs resulted. Reviewed by Venia Carbon, NP. No new orders.

## 2021-11-03 NOTE — Progress Notes (Signed)
   LOW-RISK PREGNANCY OFFICE VISIT  Patient name: Pamela Cisneros MRN [id number]  Date of birth: 05-20-2001 Chief Complaint:   Routine Prenatal Visit  Subjective:   Pamela Cisneros is a 20 y.o. G1P0 female at [redacted]w[redacted]d with an Estimated Date of Delivery: 11/25/21 being seen today for ongoing management of a low-risk pregnancy aeb has Encounter for supervision of normal first pregnancy in first trimester; Transient hypertension of pregnancy; Anemia in pregnancy; and GBS (group b Streptococcus) UTI complicating pregnancy, second trimester on their problem list.  Patient presents today with nausea.  Patient endorses fetal movement. Patient denies abdominal cramping or contractions.  Patient denies vaginal concerns including abnormal discharge, leaking of fluid, and bleeding.  Contractions: Irregular. Vag. Bleeding: None.  Movement: Present.  Reviewed past medical,surgical, social, obstetrical and family history as well as problem list, medications and allergies.  Objective   Vitals:   11/03/21 0845  BP: 128/90  Pulse: (!) 102  Weight: 224 lb (101.6 kg)  Body mass index is 38.45 kg/m.  Total Weight Gain:19 lb (8.618 kg)         Physical Examination:   General appearance: Well appearing, and in no distress  Mental status: Alert, oriented to person, place, and time  Skin: Warm & dry  Cardiovascular: Normal heart rate noted  Respiratory: Normal respiratory effort, no distress  Abdomen: Soft, gravid, nontender, AGA with Fundal Height: 37 cm  Pelvic: Cervical exam deferred           Extremities:    Fetal Status: Fetal Heart Rate (bpm): 140  Movement: Present   No results found for this or any previous visit (from the past 24 hour(s)).  Assessment & Plan:  Low-risk pregnancy of a 20 y.o., G1P0 at [redacted]w[redacted]d with an Estimated Date of Delivery: 11/25/21   1. Encounter for supervision of normal first pregnancy in third trimester -Anticipatory guidance for upcoming appts. -Patient to schedule next  appt in 1 day for a BP check.   2. [redacted] weeks gestation of pregnancy -Doing well. -Reviewed GBS positive urine. -Will complete STD testing from UA.  3. GBS bacteriuria -No cultures completed. -Informed that sometimes PCN is given as patient reaction was several years ago. 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  4. Transient hypertension of pregnancy in third trimester -Elevated BP from [employer] x 2. Otherwise normal. -BP today elevated. -Discussed collecting PreE labs and following up for BP check tomorrow. -Will plan for IOL if BP remains elevated. -Discussed how GHTN diagnosis and subsequent IOL excludes her from WB option. *Consents not signed d/t this.       Meds: No orders of the defined types were placed in this encounter.  Labs/procedures today: Lab Orders         CBC         Comp Met (CMET)         Protein / creatinine ratio, urine      Reviewed: Term labor symptoms and general obstetric precautions including but not limited to vaginal bleeding, contractions, leaking of fluid and fetal movement were reviewed in detail with the patient.  All questions were answered.  Follow-up: Return in about 1 day (around 11/04/2021), or BP Check.  Orders Placed This Encounter  Procedures  . CBC  . Comp Met (CMET)  . Protein / creatinine ratio, urine   Maryann Conners MSN, CNM 11/03/2021

## 2021-11-04 ENCOUNTER — Telehealth: Payer: Self-pay

## 2021-11-04 ENCOUNTER — Ambulatory Visit (INDEPENDENT_AMBULATORY_CARE_PROVIDER_SITE_OTHER): Payer: Medicaid Other | Admitting: *Deleted

## 2021-11-04 VITALS — BP 132/87 | HR 77

## 2021-11-04 DIAGNOSIS — O134 Gestational [pregnancy-induced] hypertension without significant proteinuria, complicating childbirth: Secondary | ICD-10-CM

## 2021-11-04 DIAGNOSIS — O133 Gestational [pregnancy-induced] hypertension without significant proteinuria, third trimester: Secondary | ICD-10-CM

## 2021-11-04 DIAGNOSIS — Z3A37 37 weeks gestation of pregnancy: Secondary | ICD-10-CM

## 2021-11-04 LAB — URINE CYTOLOGY ANCILLARY ONLY
Chlamydia: NEGATIVE
Comment: NEGATIVE
Comment: NORMAL
Neisseria Gonorrhea: NEGATIVE

## 2021-11-04 NOTE — Telephone Encounter (Signed)
Pamela Cisneros [birth year]  Patient contacted after provider reviews chart for bp check that occurred this am.  Initial BP elevated and repeat normal.  Patient informed that with elevated bp on 12/8 this results in diagnosis of Gestational Hypertension.  Patient informed that recommendation is for IOL as discussed in office.  Patient states she declines as she feels it is too early for baby.  Provider acknowledges patient decision and suggests closer follow up with appt next week. Patient states her next appt is for 12/20 because no female providers in office the week of Dec 12th.  Provider suggest patient go to office for bp check and patient agreeable.  Message sent to office for patient to be scheduled accordingly.  Patient aware of after hours availability and to report to MAU for an emergent pregnancy related concerns.    Cherre Robins MSN, CNM Advanced Practice Provider, Center for [facility]  **This visit was completed, in its entirety, via telehealth communications.  I personally spent >/=2 minutes on the phone providing recommendations, education, and guidance.**

## 2021-11-04 NOTE — Progress Notes (Addendum)
Subjective:  Pamela Cisneros is a 20 y.o. female here for BP check.   Hypertension ROS: no TIA's, no chest pain on exertion, no dyspnea on exertion, and no swelling of ankles. Denies HA, blurry vision, dizziness, heartburn or RUQ pain or edema. Patient is emotionally upset about conversation w/ provider 11/03/21 regarding admin or PCN for GBS +.    Objective:  LMP 02/18/2021   Appearance alert, well appearing, and in no distress, oriented to person, place, and time, and overweight. General exam BP noted to be well controlled today in office.    Assessment:   Blood Pressure stable.   Plan:  Current treatment plan is effective, no change in therapy. Follow up as scheduled on 11/15/21.   Patient was assessed and managed by nursing staff during this encounter. I have reviewed the chart and agree with the documentation and plan. I have also made any necessary editorial changes.  Coral Ceo, MD 11/07/2021 1:13 PM

## 2021-11-08 ENCOUNTER — Telehealth: Payer: Self-pay | Admitting: Advanced Practice Midwife

## 2021-11-08 NOTE — Telephone Encounter (Signed)
Called patient to discuss recent diagnosis of GHTN and recommendation for IOL at 37 weeks. If pt does not desire IOL, we can likely do some antenatal testing and delay IOL at least 1 week. Pt mailbox full.  Pt returned call and I discussed with her the recent diagnosis of GHTN. She feels like one blood pressure was taken when she just walked into the office and the other she was upset because of a conversation she had with the midwife.    Pt has BP check 12/14.  Will see what BP is at that time and review with attending to make final diagnosis. Pt aware that if GHTN is diagnosed, she can be supported in natural childbirth in many ways, including hydrotherapy in the shower, but not have birth immersed in the tub. Pt states understanding.

## 2021-11-09 ENCOUNTER — Ambulatory Visit: Payer: Medicaid Other | Admitting: *Deleted

## 2021-11-09 ENCOUNTER — Other Ambulatory Visit: Payer: Self-pay

## 2021-11-09 VITALS — BP 133/85 | HR 79

## 2021-11-09 DIAGNOSIS — B951 Streptococcus, group B, as the cause of diseases classified elsewhere: Secondary | ICD-10-CM

## 2021-11-09 DIAGNOSIS — O99013 Anemia complicating pregnancy, third trimester: Secondary | ICD-10-CM

## 2021-11-09 DIAGNOSIS — O133 Gestational [pregnancy-induced] hypertension without significant proteinuria, third trimester: Secondary | ICD-10-CM

## 2021-11-09 DIAGNOSIS — Z3401 Encounter for supervision of normal first pregnancy, first trimester: Secondary | ICD-10-CM

## 2021-11-09 NOTE — Progress Notes (Signed)
Agree with nurses's documentation of this patient's clinic encounter.  Azaria Bartell L, MD  

## 2021-11-09 NOTE — Progress Notes (Signed)
Subjective:  Pamela Cisneros is a 20 y.o. female here for BP check.   Hypertension ROS: taking medications as instructed, no medication side effects noted, no TIA's, no chest pain on exertion, no dyspnea on exertion, and no swelling of ankles.    Objective:  LMP 02/18/2021   BP 133/85    Pulse 79    LMP 02/18/2021   Appearance alert, well appearing, and in no distress. General exam BP noted to be well controlled today in office.    Assessment:   Blood Pressure stable.   Plan:  Review with Dr Alysia Penna,  pt may keep upcoming appt as scheduled, no changes at this time.  Advised of labor precautions, when to seek urgent care.

## 2021-11-15 ENCOUNTER — Ambulatory Visit (INDEPENDENT_AMBULATORY_CARE_PROVIDER_SITE_OTHER): Payer: Medicaid Other | Admitting: Family Medicine

## 2021-11-15 ENCOUNTER — Other Ambulatory Visit: Payer: Self-pay

## 2021-11-15 VITALS — BP 129/86 | HR 83 | Wt 227.0 lb

## 2021-11-15 DIAGNOSIS — O99013 Anemia complicating pregnancy, third trimester: Secondary | ICD-10-CM

## 2021-11-15 DIAGNOSIS — O133 Gestational [pregnancy-induced] hypertension without significant proteinuria, third trimester: Secondary | ICD-10-CM

## 2021-11-15 DIAGNOSIS — Z3A38 38 weeks gestation of pregnancy: Secondary | ICD-10-CM

## 2021-11-15 DIAGNOSIS — R8271 Bacteriuria: Secondary | ICD-10-CM

## 2021-11-15 DIAGNOSIS — Z3403 Encounter for supervision of normal first pregnancy, third trimester: Secondary | ICD-10-CM

## 2021-11-15 NOTE — Progress Notes (Addendum)
ROB, wants to know if she is still getting Waterbirth?

## 2021-11-15 NOTE — Progress Notes (Signed)
° ° °  Subjective:  Pamela Cisneros is a 20 y.o. G1P0 at [redacted]w[redacted]d being seen today for ongoing prenatal care.  She is currently monitored for the following issues for this low-risk pregnancy and has Encounter for supervision of normal first pregnancy in first trimester; Transient hypertension of pregnancy; Anemia in pregnancy; and GBS (group b Streptococcus) UTI complicating pregnancy, second trimester on their problem list.  Patient reports she is doing well. Denies any HA, blurred vision, RUQ abdominal pain, SOB, or CP. She is extremely nervous and stressed about her blood pressure and birth plan. Not really having any consistent contractions.  Contractions: Irritability. Vag. Bleeding: None.  Movement: Present. Denies leaking of fluid.   The following portions of the patient's history were reviewed and updated as appropriate: allergies, current medications, past family history, past medical history, past social history, past surgical history and problem list.   Objective:   Vitals:   11/15/21 0826 11/15/21 0832  BP: (!) 144/93 129/86  Pulse: 96 83  Weight: 227 lb (103 kg)     Fetal Status: Fetal Heart Rate (bpm): 135 Fundal Height: 38 cm Movement: Present     General:  Alert, oriented and cooperative. Patient is in no acute distress.  Skin: Skin is warm and dry. No rash noted.   Cardiovascular: Normal heart rate noted  Respiratory: Normal respiratory effort, no problems with respiration noted  Abdomen: Soft, gravid, appropriate for gestational age. Cephalic by leopolds Pain/Pressure: Present     Pelvic:  Cervical exam deferred        Extremities: Normal range of motion.  Edema: None  Mental Status: anxious mood and affect. Normal behavior.     Assessment and Plan:  Pregnancy: G1P0 at [redacted]w[redacted]d  1. Encounter for supervision of normal first pregnancy in third trimester Doing well with normal fetal movement.   2. [redacted] weeks gestation of pregnancy  3. Transient hypertension of pregnancy in  third trimester BP initially elevated, patient visibly anxious, after 5 minutes with deep breathing exercises recheck was within normal limits. Similar situation has occurred previously and thus has been considered borderline. Reports BP at home has been WNL. Discussed with Dr. Donavan Foil, will continue to consider as transient. Pt is extremely hopeful for a water birth, may continue for now. However, strictly discussed the importance of monitoring BP at home and if elevated letting us know to proceed with IOL for the safety of her and her child. She is aware and agrees.   4. Anemia during pregnancy in third trimester Hgb 10.2, asx, cont on ferrous sulfate.   5. GBS bacteriuria Mild PCN allergy, likely ancef in labor.   Term labor symptoms and general obstetric precautions including but not limited to vaginal bleeding, contractions, leaking of fluid and fetal movement were reviewed in detail with the patient. Please refer to After Visit Summary for other counseling recommendations.   Pre-eclampsia MAU precautions discussed.   Return in about 1 week (around 11/22/2021).   Allayne Stack, DO

## 2021-11-23 ENCOUNTER — Other Ambulatory Visit: Payer: Self-pay | Admitting: Obstetrics and Gynecology

## 2021-11-23 ENCOUNTER — Encounter (HOSPITAL_COMMUNITY): Payer: Self-pay | Admitting: Obstetrics and Gynecology

## 2021-11-23 ENCOUNTER — Other Ambulatory Visit: Payer: Self-pay

## 2021-11-23 ENCOUNTER — Inpatient Hospital Stay (HOSPITAL_COMMUNITY)
Admission: AD | Admit: 2021-11-23 | Discharge: 2021-11-26 | DRG: 806 | Disposition: A | Discharge status: Home / Self Care | Payer: Medicaid Other | Attending: Obstetrics and Gynecology | Admitting: Obstetrics and Gynecology

## 2021-11-23 ENCOUNTER — Other Ambulatory Visit: Payer: Self-pay | Admitting: Family Medicine

## 2021-11-23 ENCOUNTER — Ambulatory Visit (INDEPENDENT_AMBULATORY_CARE_PROVIDER_SITE_OTHER): Payer: Medicaid Other | Admitting: Nurse Practitioner

## 2021-11-23 VITALS — BP 132/94 | HR 109 | Wt 222.0 lb

## 2021-11-23 DIAGNOSIS — Z3401 Encounter for supervision of normal first pregnancy, first trimester: Secondary | ICD-10-CM

## 2021-11-23 DIAGNOSIS — Z3A39 39 weeks gestation of pregnancy: Secondary | ICD-10-CM

## 2021-11-23 DIAGNOSIS — O9081 Anemia of the puerperium: Secondary | ICD-10-CM | POA: Diagnosis not present

## 2021-11-23 DIAGNOSIS — O134 Gestational [pregnancy-induced] hypertension without significant proteinuria, complicating childbirth: Secondary | ICD-10-CM | POA: Diagnosis present

## 2021-11-23 DIAGNOSIS — O133 Gestational [pregnancy-induced] hypertension without significant proteinuria, third trimester: Secondary | ICD-10-CM

## 2021-11-23 DIAGNOSIS — O139 Gestational [pregnancy-induced] hypertension without significant proteinuria, unspecified trimester: Secondary | ICD-10-CM | POA: Diagnosis present

## 2021-11-23 DIAGNOSIS — O99824 Streptococcus B carrier state complicating childbirth: Secondary | ICD-10-CM | POA: Diagnosis present

## 2021-11-23 DIAGNOSIS — O99013 Anemia complicating pregnancy, third trimester: Secondary | ICD-10-CM

## 2021-11-23 DIAGNOSIS — Z20822 Contact with and (suspected) exposure to covid-19: Secondary | ICD-10-CM | POA: Diagnosis present

## 2021-11-23 DIAGNOSIS — O2342 Unspecified infection of urinary tract in pregnancy, second trimester: Secondary | ICD-10-CM

## 2021-11-23 DIAGNOSIS — Z3403 Encounter for supervision of normal first pregnancy, third trimester: Secondary | ICD-10-CM

## 2021-11-23 DIAGNOSIS — D62 Acute posthemorrhagic anemia: Secondary | ICD-10-CM | POA: Diagnosis not present

## 2021-11-23 DIAGNOSIS — O99019 Anemia complicating pregnancy, unspecified trimester: Secondary | ICD-10-CM | POA: Diagnosis present

## 2021-11-23 DIAGNOSIS — B951 Streptococcus, group B, as the cause of diseases classified elsewhere: Secondary | ICD-10-CM | POA: Diagnosis present

## 2021-11-23 DIAGNOSIS — Z3490 Encounter for supervision of normal pregnancy, unspecified, unspecified trimester: Secondary | ICD-10-CM

## 2021-11-23 HISTORY — DX: Essential (primary) hypertension: I10

## 2021-11-23 LAB — RESP PANEL BY RT-PCR (FLU A&B, COVID) ARPGX2
Influenza A by PCR: NEGATIVE
Influenza B by PCR: NEGATIVE
SARS Coronavirus 2 by RT PCR: NEGATIVE

## 2021-11-23 LAB — COMPREHENSIVE METABOLIC PANEL
ALT: 25 U/L (ref 0–44)
AST: 26 U/L (ref 15–41)
Albumin: 2.7 g/dL — ABNORMAL LOW (ref 3.5–5.0)
Alkaline Phosphatase: 170 U/L — ABNORMAL HIGH (ref 38–126)
Anion gap: 11 (ref 5–15)
BUN: 6 mg/dL (ref 6–20)
CO2: 19 mmol/L — ABNORMAL LOW (ref 22–32)
Calcium: 8.5 mg/dL — ABNORMAL LOW (ref 8.9–10.3)
Chloride: 101 mmol/L (ref 98–111)
Creatinine, Ser: 0.75 mg/dL (ref 0.44–1.00)
GFR, Estimated: >60 mL/min (ref >60)
Glucose, Bld: 101 mg/dL — ABNORMAL HIGH (ref 70–99)
Potassium: 3.7 mmol/L (ref 3.5–5.1)
Sodium: 131 mmol/L — ABNORMAL LOW (ref 135–145)
Total Bilirubin: 0.8 mg/dL (ref 0.3–1.2)
Total Protein: 6.7 g/dL (ref 6.5–8.1)

## 2021-11-23 LAB — CBC
HCT: 33.5 % — ABNORMAL LOW (ref 36.0–46.0)
Hemoglobin: 10.2 g/dL — ABNORMAL LOW (ref 12.0–15.0)
MCH: 22.3 pg — ABNORMAL LOW (ref 26.0–34.0)
MCHC: 30.4 g/dL (ref 30.0–36.0)
MCV: 73.3 fL — ABNORMAL LOW (ref 80.0–100.0)
Platelets: 461 10*3/uL — ABNORMAL HIGH (ref 150–400)
RBC: 4.57 MIL/uL (ref 3.87–5.11)
RDW: 17.9 % — ABNORMAL HIGH (ref 11.5–15.5)
WBC: 14.8 10*3/uL — ABNORMAL HIGH (ref 4.0–10.5)
nRBC: 0 % (ref 0.0–0.2)

## 2021-11-23 LAB — PROTEIN / CREATININE RATIO, URINE
Creatinine, Urine: 416.52 mg/dL
Protein Creatinine Ratio: 0.11 mg/mg{Cre} (ref 0.00–0.15)
Total Protein, Urine: 47 mg/dL

## 2021-11-23 MED ORDER — OXYTOCIN BOLUS FROM INFUSION
333.0000 mL | Freq: Once | INTRAVENOUS | Status: AC
  Administered 2021-11-24: 09:00:00 333 mL via INTRAVENOUS

## 2021-11-23 MED ORDER — LIDOCAINE HCL (PF) 1 % IJ SOLN
30.0000 mL | INTRAMUSCULAR | Status: AC | PRN
  Administered 2021-11-24: 09:00:00 30 mL via SUBCUTANEOUS
  Filled 2021-11-23: qty 30

## 2021-11-23 MED ORDER — TERBUTALINE SULFATE 1 MG/ML IJ SOLN: 0.2500 mg | Freq: Once | INTRAMUSCULAR | Status: DC | PRN

## 2021-11-23 MED ORDER — CEFAZOLIN SODIUM-DEXTROSE 1-4 GM/50ML-% IV SOLN
1.0000 g | Freq: Three times a day (TID) | INTRAVENOUS | Status: DC
  Administered 2021-11-23 – 2021-11-24 (×2): 1 g via INTRAVENOUS
  Filled 2021-11-23 (×3): qty 50

## 2021-11-23 MED ORDER — OXYCODONE-ACETAMINOPHEN 5-325 MG PO TABS: 1.0000 | ORAL_TABLET | ORAL | Status: DC | PRN

## 2021-11-23 MED ORDER — MISOPROSTOL 50MCG HALF TABLET
50.0000 ug | ORAL_TABLET | ORAL | Status: DC
  Administered 2021-11-23: 17:00:00 50 ug via BUCCAL
  Filled 2021-11-23: qty 1

## 2021-11-23 MED ORDER — MISOPROSTOL 25 MCG QUARTER TABLET: 50.0000 ug | ORAL_TABLET | ORAL | Status: DC | PRN

## 2021-11-23 MED ORDER — CEFAZOLIN SODIUM-DEXTROSE 2-4 GM/100ML-% IV SOLN
2.0000 g | Freq: Once | INTRAVENOUS | Status: AC
  Administered 2021-11-23: 14:00:00 2 g via INTRAVENOUS
  Filled 2021-11-23: qty 100

## 2021-11-23 MED ORDER — LACTATED RINGERS IV SOLN: 500.0000 mL | INTRAVENOUS | Status: DC | PRN

## 2021-11-23 MED ORDER — SOD CITRATE-CITRIC ACID 500-334 MG/5ML PO SOLN
30.0000 mL | ORAL | Status: DC | PRN
  Administered 2021-11-24: 03:00:00 30 mL via ORAL
  Filled 2021-11-23: qty 30

## 2021-11-23 MED ORDER — ONDANSETRON HCL 4 MG/2ML IJ SOLN: 4.0000 mg | Freq: Four times a day (QID) | INTRAMUSCULAR | Status: DC | PRN

## 2021-11-23 MED ORDER — ACETAMINOPHEN 325 MG PO TABS: 650.0000 mg | ORAL_TABLET | ORAL | Status: DC | PRN

## 2021-11-23 MED ORDER — OXYTOCIN-SODIUM CHLORIDE 30-0.9 UT/500ML-% IV SOLN
1.0000 m[IU]/min | INTRAVENOUS | Status: DC
  Administered 2021-11-23: 21:00:00 2 m[IU]/min via INTRAVENOUS

## 2021-11-23 MED ORDER — OXYCODONE-ACETAMINOPHEN 5-325 MG PO TABS: 2.0000 | ORAL_TABLET | ORAL | Status: DC | PRN

## 2021-11-23 MED ORDER — FENTANYL CITRATE (PF) 100 MCG/2ML IJ SOLN
50.0000 ug | INTRAMUSCULAR | Status: DC | PRN
  Administered 2021-11-23 (×2): 100 ug via INTRAVENOUS
  Administered 2021-11-24 (×2): 50 ug via INTRAVENOUS
  Administered 2021-11-24: 01:00:00 100 ug via INTRAVENOUS
  Filled 2021-11-23 (×5): qty 2

## 2021-11-23 MED ORDER — LACTATED RINGERS IV SOLN: INTRAVENOUS | Status: DC

## 2021-11-23 MED ORDER — OXYTOCIN-SODIUM CHLORIDE 30-0.9 UT/500ML-% IV SOLN
2.5000 [IU]/h | INTRAVENOUS | Status: DC
  Filled 2021-11-23: qty 500

## 2021-11-23 NOTE — MAU Note (Signed)
.  Pamela Cisneros is a 20 y.o. at [redacted]w[redacted]d here in MAU reporting: sent from office for direct admission for L&D. Denies PIH s/sx. Denies VB or LOF. Endorses good fetal movement. Having intermittent lower abdominal cramping.   Pain score: 2 Vitals:   11/23/21 1209  BP: (!) 147/96  Pulse: (!) 141  Resp: 15  Temp: 98.4 F (36.9 C)  SpO2: 97%     FHT:146 Lab orders placed from triage:  admission labs

## 2021-11-23 NOTE — Plan of Care (Signed)
°  Problem: Education: Goal: Ability to make informed decisions regarding treatment and plan of care will improve Outcome: Progressing   Problem: Pain Management: Goal: Relief or control of pain from uterine contractions will improve Outcome: Progressing   Problem: Education: Goal: Knowledge of General Education information will improve Description: Including pain rating scale, medication(s)/side effects and non-pharmacologic comfort measures Outcome: Progressing   Problem: Clinical Measurements: Goal: Ability to maintain clinical measurements within normal limits will improve Outcome: Progressing Goal: Diagnostic test results will improve Outcome: Progressing   Problem: Education: Goal: Knowledge of disease or condition will improve Outcome: Progressing Goal: Knowledge of the prescribed therapeutic regimen will improve Outcome: Progressing

## 2021-11-23 NOTE — Progress Notes (Signed)
Labor Progress Note Pamela Cisneros is a 20 y.o. G1P0 at [redacted]w[redacted]d presented for IOL due to gestational hypertension.   S: Doing well, feeling occasional cramping. Not bothersome.   O:  BP 112/79    Pulse 79    Temp 98.9 F (37.2 C) (Oral)    Resp 15    LMP 02/18/2021    SpO2 99%  EFM: 135/mod/15x15/none  CVE: Dilation: 3.5 Effacement (%): 80 Cervical Position: Middle Station: -2 Presentation: Vertex Exam by:: Dr. Annia Friendly   [name]: 20 y.o. G1P0 [redacted]w[redacted]d  #Labor: Some progression since last check. Plan to start pit 2x2, hopeful for AROM on the next check if favorable position and contraction pattern.  #Pain: PRN  #FWB: Cat I  #GBS positive PCN  #Gestational hypertension: No severe ranges. Pre-e labs Wnl. Asymptomatic. Cont to monitor.   Pamela Stack, DO 9:25 PM

## 2021-11-23 NOTE — Progress Notes (Signed)
+   Fetal movement. No complaints.  

## 2021-11-23 NOTE — Progress Notes (Signed)
° ° °  Subjective:  Pamela Cisneros is a 20 y.o. G1P0 at [redacted]w[redacted]d being seen today for ongoing prenatal care.  She is currently monitored for the following issues for this high-risk pregnancy and has Encounter for supervision of normal first pregnancy in first trimester; Transient hypertension of pregnancy; Anemia in pregnancy; and GBS (group b Streptococcus) UTI complicating pregnancy, second trimester on their problem list.  Patient reports  stuffy nose .  Contractions: Irritability. Vag. Bleeding: None.  Movement: Present. Denies leaking of fluid.   The following portions of the patient's history were reviewed and updated as appropriate: allergies, current medications, past family history, past medical history, past social history, past surgical history and problem list. Problem list updated.  Objective:   Vitals:   11/23/21 0841 11/23/21 0926  BP: (!) 134/92 (!) 132/94  Pulse: (!) 109   Weight: 222 lb (100.7 kg)     Fetal Status: Fetal Heart Rate (bpm): 140   Movement: Present     General:  Alert, oriented and cooperative. Patient is in no acute distress.  Skin: Skin is warm and dry. No rash noted.   Cardiovascular: Normal heart rate noted  Respiratory: Normal respiratory effort, no problems with respiration noted  Abdomen: Soft, gravid, appropriate for gestational age. Pain/Pressure: Present     Pelvic:  Cervical exam performed        Extremities: Normal range of motion.  Edema: None  Mental Status: Normal mood and affect. Normal behavior. Normal judgment and thought content.   Urinalysis:      Assessment and Plan:  Pregnancy: G1P0 at [redacted]w[redacted]d  1. Encounter for supervision of normal first pregnancy in third trimester Reports no headaches or edema Had BP labs checked in Early December  2. Gestational hypertension w/o significant proteinuria in 3rd trimester BP elevated today at visit BPs at home have been 133/87 Consult with Dr. Para March - at term with elevated BP - second episode if  elevated BP in the office in thrird trimester - will admit Called Dr. Despina Hidden - unable to give full report as he was in the OR.  Spoke with Lacey Jensen, CNM  Will go to MAU to await admission to L&D - unable to accommodate direct admit at this time Orders placed by Dr. Okey Dupre report to Dr. Despina Hidden after case in OR was completed.  3. [redacted] weeks gestation of pregnancy   Term labor symptoms and general obstetric precautions including but not limited to vaginal bleeding, contractions, leaking of fluid and fetal movement were reviewed in detail with the patient. Please refer to After Visit Summary for other counseling recommendations.  Return in about 5 weeks (around 12/28/2021) for postpartum visit.  Nolene Bernheim, RN, MSN, NP-BC Nurse Practitioner, [facility] for [employer], [facility] 11/23/2021 10:43 AM
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
## 2021-11-24 ENCOUNTER — Inpatient Hospital Stay (HOSPITAL_COMMUNITY): Payer: Medicaid Other | Admitting: Anesthesiology

## 2021-11-24 ENCOUNTER — Encounter (HOSPITAL_COMMUNITY): Payer: Self-pay | Admitting: Obstetrics and Gynecology

## 2021-11-24 DIAGNOSIS — O99824 Streptococcus B carrier state complicating childbirth: Secondary | ICD-10-CM

## 2021-11-24 DIAGNOSIS — Z3A39 39 weeks gestation of pregnancy: Secondary | ICD-10-CM | POA: Diagnosis not present

## 2021-11-24 DIAGNOSIS — O134 Gestational [pregnancy-induced] hypertension without significant proteinuria, complicating childbirth: Secondary | ICD-10-CM | POA: Diagnosis not present

## 2021-11-24 LAB — CBC WITH DIFFERENTIAL/PLATELET
Abs Immature Granulocytes: 0.06 10*3/uL (ref 0.00–0.07)
Abs Immature Granulocytes: 0.15 10*3/uL — ABNORMAL HIGH (ref 0.00–0.07)
Basophils Absolute: 0.1 10*3/uL (ref 0.0–0.1)
Basophils Absolute: 0.1 10*3/uL (ref 0.0–0.1)
Basophils Relative: 1 %
Basophils Relative: 0 %
Eosinophils Absolute: 0 10*3/uL (ref 0.0–0.5)
Eosinophils Absolute: 0 10*3/uL (ref 0.0–0.5)
Eosinophils Relative: 0 %
Eosinophils Relative: 0 %
HCT: 31.2 % — ABNORMAL LOW (ref 36.0–46.0)
HCT: 28.4 % — ABNORMAL LOW (ref 36.0–46.0)
Hemoglobin: 9.7 g/dL — ABNORMAL LOW (ref 12.0–15.0)
Hemoglobin: 8.6 g/dL — ABNORMAL LOW (ref 12.0–15.0)
Immature Granulocytes: 0 %
Immature Granulocytes: 1 %
Lymphocytes Relative: 12 %
Lymphocytes Relative: 5 %
Lymphs Abs: 1.9 10*3/uL (ref 0.7–4.0)
Lymphs Abs: 1.1 10*3/uL (ref 0.7–4.0)
MCH: 22.5 pg — ABNORMAL LOW (ref 26.0–34.0)
MCH: 22.1 pg — ABNORMAL LOW (ref 26.0–34.0)
MCHC: 31.1 g/dL (ref 30.0–36.0)
MCHC: 30.3 g/dL (ref 30.0–36.0)
MCV: 72.4 fL — ABNORMAL LOW (ref 80.0–100.0)
MCV: 72.8 fL — ABNORMAL LOW (ref 80.0–100.0)
Monocytes Absolute: 1.2 10*3/uL — ABNORMAL HIGH (ref 0.1–1.0)
Monocytes Absolute: 1.3 10*3/uL — ABNORMAL HIGH (ref 0.1–1.0)
Monocytes Relative: 8 %
Monocytes Relative: 6 %
Neutro Abs: 12.4 10*3/uL — ABNORMAL HIGH (ref 1.7–7.7)
Neutro Abs: 18.7 10*3/uL — ABNORMAL HIGH (ref 1.7–7.7)
Neutrophils Relative %: 79 %
Neutrophils Relative %: 88 %
Platelets: 421 10*3/uL — ABNORMAL HIGH (ref 150–400)
Platelets: 432 10*3/uL — ABNORMAL HIGH (ref 150–400)
RBC: 4.31 MIL/uL (ref 3.87–5.11)
RBC: 3.9 MIL/uL (ref 3.87–5.11)
RDW: 17.7 % — ABNORMAL HIGH (ref 11.5–15.5)
RDW: 17.6 % — ABNORMAL HIGH (ref 11.5–15.5)
WBC: 15.6 10*3/uL — ABNORMAL HIGH (ref 4.0–10.5)
WBC: 21.3 10*3/uL — ABNORMAL HIGH (ref 4.0–10.5)
nRBC: 0 % (ref 0.0–0.2)
nRBC: 0 % (ref 0.0–0.2)

## 2021-11-24 LAB — RPR: RPR Ser Ql: NONREACTIVE

## 2021-11-24 MED ORDER — IBUPROFEN 600 MG PO TABS
600.0000 mg | ORAL_TABLET | Freq: Four times a day (QID) | ORAL | Status: DC
  Administered 2021-11-24 – 2021-11-26 (×8): 600 mg via ORAL
  Filled 2021-11-24 (×8): qty 1

## 2021-11-24 MED ORDER — FENTANYL-BUPIVACAINE-NACL 0.5-0.125-0.9 MG/250ML-% EP SOLN: 12.0000 mL/h | EPIDURAL | Status: DC | PRN

## 2021-11-24 MED ORDER — COCONUT OIL OIL
1.0000 "application " | TOPICAL_OIL | Status: DC | PRN
  Administered 2021-11-25: 1 via TOPICAL

## 2021-11-24 MED ORDER — EPHEDRINE 5 MG/ML INJ: 10.0000 mg | INTRAVENOUS | Status: DC | PRN

## 2021-11-24 MED ORDER — LACTATED RINGERS IV SOLN: 500.0000 mL | Freq: Once | INTRAVENOUS | Status: DC

## 2021-11-24 MED ORDER — ONDANSETRON HCL 4 MG/2ML IJ SOLN: 4.0000 mg | INTRAMUSCULAR | Status: DC | PRN

## 2021-11-24 MED ORDER — DIPHENHYDRAMINE HCL 25 MG PO CAPS: 25.0000 mg | ORAL_CAPSULE | Freq: Four times a day (QID) | ORAL | Status: DC | PRN

## 2021-11-24 MED ORDER — PRENATAL MULTIVITAMIN CH
1.0000 | ORAL_TABLET | Freq: Every day | ORAL | Status: DC
  Administered 2021-11-25: 11:00:00 1 via ORAL
  Filled 2021-11-24: qty 1

## 2021-11-24 MED ORDER — SIMETHICONE 80 MG PO CHEW: 80.0000 mg | CHEWABLE_TABLET | ORAL | Status: DC | PRN

## 2021-11-24 MED ORDER — FENTANYL-BUPIVACAINE-NACL 0.5-0.125-0.9 MG/250ML-% EP SOLN
12.0000 mL/h | EPIDURAL | Status: DC | PRN
  Administered 2021-11-24: 03:00:00 12 mL/h via EPIDURAL
  Filled 2021-11-24: qty 250

## 2021-11-24 MED ORDER — DIPHENHYDRAMINE HCL 50 MG/ML IJ SOLN
12.5000 mg | INTRAMUSCULAR | Status: DC | PRN
  Administered 2021-11-24: 04:00:00 12.5 mg via INTRAVENOUS
  Filled 2021-11-24: qty 1

## 2021-11-24 MED ORDER — DIBUCAINE (PERIANAL) 1 % EX OINT: 1.0000 "application " | TOPICAL_OINTMENT | CUTANEOUS | Status: DC | PRN

## 2021-11-24 MED ORDER — WITCH HAZEL-GLYCERIN EX PADS: 1.0000 "application " | MEDICATED_PAD | CUTANEOUS | Status: DC | PRN

## 2021-11-24 MED ORDER — PHENYLEPHRINE 40 MCG/ML (10ML) SYRINGE FOR IV PUSH (FOR BLOOD PRESSURE SUPPORT): 80.0000 ug | PREFILLED_SYRINGE | INTRAVENOUS | Status: DC | PRN

## 2021-11-24 MED ORDER — FLUTICASONE PROPIONATE 50 MCG/ACT NA SUSP
1.0000 | Freq: Two times a day (BID) | NASAL | Status: DC | PRN
  Administered 2021-11-24: 18:00:00 1 via NASAL
  Filled 2021-11-24: qty 16

## 2021-11-24 MED ORDER — ACETAMINOPHEN 325 MG PO TABS
650.0000 mg | ORAL_TABLET | ORAL | Status: DC | PRN
  Administered 2021-11-25 (×2): 650 mg via ORAL
  Filled 2021-11-24 (×2): qty 2

## 2021-11-24 MED ORDER — BENZOCAINE-MENTHOL 20-0.5 % EX AERO
1.0000 "application " | INHALATION_SPRAY | CUTANEOUS | Status: DC | PRN
  Administered 2021-11-24 – 2021-11-26 (×3): 1 via TOPICAL
  Filled 2021-11-24 (×4): qty 56

## 2021-11-24 MED ORDER — TRANEXAMIC ACID-NACL 1000-0.7 MG/100ML-% IV SOLN
INTRAVENOUS | Status: AC
  Administered 2021-11-24: 09:00:00 1000 mg
  Filled 2021-11-24: qty 100

## 2021-11-24 MED ORDER — TRANEXAMIC ACID-NACL 1000-0.7 MG/100ML-% IV SOLN: 1000.0000 mg | INTRAVENOUS | Status: DC

## 2021-11-24 MED ORDER — DIPHENHYDRAMINE HCL 50 MG/ML IJ SOLN: 12.5000 mg | INTRAMUSCULAR | Status: DC | PRN

## 2021-11-24 MED ORDER — TETANUS-DIPHTH-ACELL PERTUSSIS 5-2.5-18.5 LF-MCG/0.5 IM SUSY: 0.5000 mL | PREFILLED_SYRINGE | Freq: Once | INTRAMUSCULAR | Status: DC

## 2021-11-24 MED ORDER — MEDROXYPROGESTERONE ACETATE 150 MG/ML IM SUSP: 150.0000 mg | INTRAMUSCULAR | Status: DC | PRN

## 2021-11-24 MED ORDER — LIDOCAINE HCL (PF) 1 % IJ SOLN
INTRAMUSCULAR | Status: DC | PRN
  Administered 2021-11-24: 8 mL via EPIDURAL

## 2021-11-24 MED ORDER — LACTATED RINGERS IV SOLN
500.0000 mL | Freq: Once | INTRAVENOUS | Status: AC
  Administered 2021-11-24: 02:00:00 500 mL via INTRAVENOUS

## 2021-11-24 MED ORDER — SENNOSIDES-DOCUSATE SODIUM 8.6-50 MG PO TABS
2.0000 | ORAL_TABLET | Freq: Every day | ORAL | Status: DC
  Administered 2021-11-25: 09:00:00 2 via ORAL
  Filled 2021-11-24: qty 2

## 2021-11-24 MED ORDER — ONDANSETRON HCL 4 MG PO TABS: 4.0000 mg | ORAL_TABLET | ORAL | Status: DC | PRN

## 2021-11-24 MED ORDER — MEASLES, MUMPS & RUBELLA VAC IJ SOLR: 0.5000 mL | Freq: Once | INTRAMUSCULAR | Status: DC

## 2021-11-24 MED ORDER — BUPIVACAINE HCL (PF) 0.25 % IJ SOLN
INTRAMUSCULAR | Status: DC | PRN
  Administered 2021-11-24 (×2): 5 mL via EPIDURAL

## 2021-11-24 NOTE — Progress Notes (Signed)
Labor Progress Note Pamela Cisneros is a 20 y.o. G1P0 at [redacted]w[redacted]d presented for IOL due to gHTN.   Feeling a lot better per RN after epidural placement. Contractions have spaced.   O:  BP 122/66    Pulse 77    Temp 97.9 F (36.6 C) (Oral)    Resp 15    Ht 5\' 4"  (1.626 m)    Wt 100.7 kg    LMP 02/18/2021    SpO2 99%    BMI 38.11 kg/m  EFM: 135/mod/15x15/none  CVE: Dilation: 4 Effacement (%): 80 Cervical Position: Middle Station: -1, 0 Presentation: Vertex Exam by:: [email or web address] RN   [name]: 20 y.o. G1P0 [redacted]w[redacted]d  #Labor: Some progression in station, but otherwise relatively unchanged. Will restart pit. If no change on next check, place IUPC.  #Pain: Epidural   #FWB: Cat 1  #GBS positive  #gHTN: BP improved s/p epidural placement. Will monitor.   [redacted]w[redacted]d, DO 5:47 AM

## 2021-11-24 NOTE — Progress Notes (Signed)
Labor Progress Note   Called by RN due to occasional variables and a few lates with contractions. Fortunately were improving with a position change on to her side. Pit currently at 4. Last cervical check about 2 hours ago, 4/80/-1.   Evaluated at bedside. Planned for recheck +/- IUPC. On check, she was about 9.5/100/0 with a small amount of cervix left anteriorly. Positive scalp stimulation with check. FHT currently reassuring with baseline 140/mod/15x15/early.   Will place into high fowlers and monitor for the next 30 minutes, recheck sooner if feeling pressure/FHT change etc.   Allayne Stack, DO

## 2021-11-24 NOTE — Anesthesia Procedure Notes (Signed)
Epidural Patient location during procedure: OB Start time: 11/24/2021 2:55 AM End time: 11/24/2021 3:00 AM  Staffing Anesthesiologist: Bethena Midget, MD  Preanesthetic Checklist Completed: patient identified, IV checked, site marked, risks and benefits discussed, surgical consent, monitors and equipment checked, pre-op evaluation and timeout performed  Epidural Patient position: sitting Prep: DuraPrep and site prepped and draped Patient monitoring: continuous pulse ox and blood pressure Approach: midline Location: L3-L4 Injection technique: LOR air  Needle:  Needle type: Tuohy  Needle gauge: 17 G Needle length: 9 cm and 9 Needle insertion depth: 6 cm Catheter type: closed end flexible Catheter size: 19 Gauge Catheter at skin depth: 12 cm Test dose: negative  Assessment Events: blood not aspirated, injection not painful, no injection resistance, no paresthesia and negative IV test

## 2021-11-24 NOTE — Lactation Note (Signed)
This note was copied from a baby's chart. Lactation Consultation Note  Patient Name: Pamela Cisneros [id number] Date: 11/24/2021 Reason for consult: Initial assessment;1st time breastfeeding;Primapara;Term Age:21 hours   Initial Lactation Consult:  P1 mother whose infant is now 58 hours old.  This is a term baby at 39+6 weeks.  Mother's current feeding preference is breast.  Baby "Josephine" asleep STS on mother's chest.  Taught hand expression; no drops observed.  Reviewed breast feeding basics with mother.  Suggested she call her RN/LC for latch assistance as needed.  Mother will feed 8-12 times/24 hours or sooner if "[name]" shows cues.    Father present and asleep.   Maternal Data Has patient been taught Hand Expression?: Yes Does the patient have breastfeeding experience prior to this delivery?: No  Feeding Mother's Current Feeding Choice: Breast Milk  LATCH Score Latch: Repeated attempts needed to sustain latch, nipple held in mouth throughout feeding, stimulation needed to elicit sucking reflex.  Audible Swallowing: A few with stimulation  Type of Nipple: Everted at rest and after stimulation  Comfort (Breast/Nipple): Soft / non-tender  Hold (Positioning): Assistance needed to correctly position infant at breast and maintain latch.  LATCH Score: 7   Lactation Tools Discussed/Used    Interventions Interventions: Breast feeding basics reviewed;Education  Discharge Pump: Personal (Mother has private insurance and [facility]; will follow through and possibly obtain a second DEBP) WIC Program: Yes  Consult Status Consult Status: Follow-up Date: 11/25/21 Follow-up type: In-patient    Woodroe Vogan R Merlyn Conley 11/24/2021, 1:20 PM

## 2021-11-24 NOTE — Progress Notes (Signed)
Labor Progress Note Pamela Cisneros is a 20 y.o. G1P0 at [redacted]w[redacted]d presented for IOL due to gestational hypertension.  S: Feeling contractions a lot stronger. Having them about every minute and breathing through them.   O:  BP (!) 152/97    Pulse 82    Temp (!) 97.4 F (36.3 C) (Oral)    Resp 17    LMP 02/18/2021    SpO2 99%  EFM: 135/mod/15x15/none  CVE: Dilation: 4 Effacement (%): 80 Cervical Position: Middle Station: -2 Presentation: Vertex Exam by:: Dr. Annia Friendly   [name]: 20 y.o. G1P0 [redacted]w[redacted]d  #Labor: Some progression since last check with head well applied. After verbal consent, performed AROM with clear fluid. Tolerated well. Head well applied following procedure. Stopped pit for the time being while contracting quite frequently, may restart if spaces.  #Pain: Planning for epidural, rechecking CBC stat and then placement.  #FWB: Cat I  #GBS positive  #Gestational hypertension: No severe ranges, recently higher while patient is very uncomfortable. Cont to monitor.    Allayne Stack, DO 1:52 AM

## 2021-11-24 NOTE — Discharge Summary (Signed)
Postpartum Discharge Summary     Patient Name: Pamela Cisneros DOB: 2001/10/06 MRN: [id number]  Date of admission: 11/23/2021 Delivery date:11/24/2021  Delivering provider: Renard Matter  Date of discharge: 21/31/2022  Admitting diagnosis: Pregnancy [Z34.90] Intrauterine pregnancy: [redacted]w[redacted]d    Secondary diagnosis:  Principal Problem:   Pregnancy Active Problems:   Encounter for supervision of normal first pregnancy in first trimester   Gestational hypertension   Anemia in pregnancy   GBS (group b Streptococcus) UTI complicating pregnancy, second trimester  Additional problems: None    Discharge diagnosis: Term Pregnancy Delivered                                              Post partum procedures: none Augmentation: AROM, Pitocin, Cytotec, and IP Foley Complications: None  Hospital course: Induction of Labor With Vaginal Delivery   20y.o. yo G1P1001 at 364w6das admitted to the hospital 11/23/2021 for induction of labor.  Indication for induction:  gHTN .  Patient had an uncomplicated labor course as follows: Membrane Rupture Time/Date: 1:47 AM ,11/24/2021   Delivery Method:Vaginal, Spontaneous  Episiotomy: None  Lacerations:  2nd degree  Details of delivery can be found in separate delivery note.  Patient had a routine postpartum course. Patient is discharged home 11/26/21.  Newborn Data: Birth date:11/24/2021  Birth time:8:31 AM  Gender:Female  Living status:Living  Apgars:8 ,9  Weight:3396 g   Magnesium Sulfate received: No BMZ received: No Rhophylac:N/A MMR:N/A T-DaP:Given prenatally Flu: declined Transfusion:No  Physical exam  Vitals:   11/25/21 1518 11/25/21 2158 11/25/21 2300 11/26/21 0618  BP: 132/88 133/65 119/71   Pulse: 85 76  87  Resp: 18 18  18   Temp: 97.9 F (36.6 C) 98.3 F (36.8 C)  97.9 F (36.6 C)  TempSrc: Oral Oral  Oral  SpO2: 100% 98%  100%  Weight:      Height:       General: alert, cooperative, and no distress Lochia:  appropriate Uterine Fundus: firm Incision: N/A DVT Evaluation: No evidence of DVT seen on physical exam. Labs: Lab Results  Component Value Date   WBC 15.2 (H) 11/25/2021   HGB 6.4 (LL) 11/25/2021   HCT 20.9 (L) 11/25/2021   MCV 73.6 (L) 11/25/2021   PLT 327 11/25/2021   CMP Latest Ref Rng & Units 11/23/2021  Glucose 70 - 99 mg/dL 101(H)  BUN 6 - 20 mg/dL 6  Creatinine 0.44 - 1.00 mg/dL 0.75  Sodium 135 - 145 mmol/L 131(L)  Potassium 3.5 - 5.1 mmol/L 3.7  Chloride 98 - 111 mmol/L 101  CO2 22 - 32 mmol/L 19(L)  Calcium 8.9 - 10.3 mg/dL 8.5(L)  Total Protein 6.5 - 8.1 g/dL 6.7  Total Bilirubin 0.3 - 1.2 mg/dL 0.8  Alkaline Phos 38 - 126 U/L 170(H)  AST 15 - 41 U/L 26  ALT 0 - 44 U/L 25   Edinburgh Score: Edinburgh Postnatal Depression Scale Screening Tool 11/24/2021  I have been able to laugh and see the funny side of things. 0  I have looked forward with enjoyment to things. 0  I have blamed myself unnecessarily when things went wrong. 2  I have been anxious or worried for no good reason. 1  I have felt scared or panicky for no good reason. 0  Things have been getting on top of me. 1  I  have been so unhappy that I have had difficulty sleeping. 1  I have felt sad or miserable. 1  I have been so unhappy that I have been crying. 0  The thought of harming myself has occurred to me. 0  Edinburgh Postnatal Depression Scale Total 6     After visit meds:  Allergies as of 11/26/2021       Reactions   Penicillins Hives        Medication List     STOP taking these medications    Diclegis 10-10 MG Tbec Generic drug: Doxylamine-Pyridoxine       TAKE these medications    acetaminophen 325 MG tablet Commonly known as: Tylenol Take 2 tablets (650 mg total) by mouth every 4 (four) hours as needed (for pain scale < 4).   Blood Pressure Kit Devi 1 kit by Does not apply route once a week.   ferrous sulfate 325 (65 FE) MG tablet Take 1 tablet (325 mg total) by  mouth every other day.   furosemide 20 MG tablet Commonly known as: LASIX Take 1 tablet (20 mg total) by mouth daily.   ibuprofen 600 MG tablet Commonly known as: ADVIL Take 1 tablet (600 mg total) by mouth every 6 (six) hours.   NIFEdipine 30 MG 24 hr tablet Commonly known as: ADALAT CC Take 1 tablet (30 mg total) by mouth daily.   Prenatal Gummies 0.18-25 MG Chew Chew by mouth.               Durable Medical Equipment  (From admission, onward)           Start     Ordered   11/25/21 0911  For home use only DME double electric breast pump  Once       Comments: ICD-10 code Z39.1   11/25/21 0911             Discharge home in stable condition Infant Feeding: Breast Infant Disposition:home with mother Discharge instruction: per After Visit Summary and Postpartum booklet. Activity: Advance as tolerated. Pelvic rest for 6 weeks.  Diet: routine diet Future Appointments: Future Appointments  Date Time Provider [place]  12/01/2021  1:20 PM [place] None  12/26/2021  3:10 PM Constant, Vickii Chafe, MD [place] None   Follow up Visit: Message sent to [facility] on 11/24/2021 by Dr. Cy Blamer  Please schedule this patient for a In person postpartum visit in 4 weeks with the following provider: Any provider. Additional Postpartum F/U:BP check 1 week  High risk pregnancy complicated by: HTN Delivery mode:  Vaginal, Spontaneous  Anticipated Birth Control:   declines  Future Appointments  Date Time Provider [place]  12/01/2021  1:20 PM [place] None  12/26/2021  3:10 PM Constant, Peggy, MD [place] None    11/26/2021 Caren Macadam, MD

## 2021-11-24 NOTE — Lactation Note (Signed)
This note was copied from a baby's chart. Lactation Consultation Note  Patient Name: Girl Mykelle Cockerell YJEHU'D Date: 11/24/2021 Reason for consult: L&D Initial assessment Age:21 hours   Lactation Note:  Attempted to visit with mother, however, she had just received her meal.  Baby was not showing feeding cues.  Asked her to call for my assistance as soon as she was finished eating.  Mother appreciative.   Maternal Data Does the patient have breastfeeding experience prior to this delivery?: No  Feeding Mother's Current Feeding Choice: Breast Milk  LATCH Score Latch: Repeated attempts needed to sustain latch, nipple held in mouth throughout feeding, stimulation needed to elicit sucking reflex.  Audible Swallowing: A few with stimulation  Type of Nipple: Everted at rest and after stimulation  Comfort (Breast/Nipple): Soft / non-tender  Hold (Positioning): Assistance needed to correctly position infant at breast and maintain latch.  LATCH Score: 7   Lactation Tools Discussed/Used    Interventions Interventions: Assisted with latch;Skin to skin;Education  Discharge    Consult Status Consult Status: Follow-up from L&D    Irene Pap Gilma Bessette 11/24/2021, 12:41 PM

## 2021-11-24 NOTE — Anesthesia Postprocedure Evaluation (Signed)
Anesthesia Post Note  Patient: Pamela Cisneros  Procedure(s) Performed: AN AD HOC LABOR EPIDURAL     Patient location during evaluation: Mother Baby Anesthesia Type: Epidural Level of consciousness: awake Pain management: satisfactory to patient Vital Signs Assessment: post-procedure vital signs reviewed and stable Respiratory status: spontaneous breathing Cardiovascular status: stable Anesthetic complications: no   No notable events documented.  Last Vitals:  Vitals:   11/24/21 1148 11/24/21 1438  BP: 120/84 (!) 126/59  Pulse: (!) 130 96  Resp: 17 17  Temp: 36.8 C 36.8 C  SpO2: 100% 100%    Last Pain:  Vitals:   11/24/21 1438  TempSrc:   PainSc: 0-No pain   Pain Goal: Patients Stated Pain Goal: 0 (11/24/21 0403)                 Cephus Shelling

## 2021-11-24 NOTE — Lactation Note (Signed)
This note was copied from a baby's chart. Lactation Consultation Note  Patient Name: Pamela Cisneros [id number] Date: 11/24/2021 Reason for consult: L&D Initial assessment Age:21 hours  Baby cueing on FOB chest.  Assisted with latching in both cradle and football.  Baby eager, comes off and on breast. Lactation to follow up on MBU.  Maternal Data Does the patient have breastfeeding experience prior to this delivery?: No  Feeding Mother's Current Feeding Choice: Breast Milk  LATCH Score Latch: Repeated attempts needed to sustain latch, nipple held in mouth throughout feeding, stimulation needed to elicit sucking reflex.  Audible Swallowing: A few with stimulation  Type of Nipple: Everted at rest and after stimulation  Comfort (Breast/Nipple): Soft / non-tender  Hold (Positioning): Assistance needed to correctly position infant at breast and maintain latch.  LATCH Score: 7    Interventions Interventions: Assisted with latch;Skin to skin;Education   Consult Status Consult Status: Follow-up from L&D    [name] [facility] 11/24/2021, 10:04 AM

## 2021-11-24 NOTE — Anesthesia Preprocedure Evaluation (Signed)
Anesthesia Evaluation  Patient identified by MRN, date of birth, ID band Patient awake    Reviewed: Allergy & Precautions, H&P , NPO status , Patient's Chart, lab work & pertinent test results, reviewed documented beta blocker date and time   Airway Mallampati: II  TM Distance: >3 FB Neck ROM: full    Dental no notable dental hx.    Pulmonary neg pulmonary ROS,    Pulmonary exam normal breath sounds clear to auscultation       Cardiovascular hypertension, Pt. on medications negative cardio ROS Normal cardiovascular exam Rhythm:regular Rate:Normal     Neuro/Psych negative neurological ROS  negative psych ROS   GI/Hepatic negative GI ROS, Neg liver ROS,   Endo/Other  negative endocrine ROS  Renal/GU negative Renal ROS  negative genitourinary   Musculoskeletal   Abdominal   Peds  Hematology negative hematology ROS (+) Blood dyscrasia, anemia ,   Anesthesia Other Findings   Reproductive/Obstetrics (+) Pregnancy                             Anesthesia Physical Anesthesia Plan  ASA: 2  Anesthesia Plan: Epidural   Post-op Pain Management:    Induction:   PONV Risk Score and Plan:   Airway Management Planned: Natural Airway  Additional Equipment:   Intra-op Plan:   Post-operative Plan:   Informed Consent: I have reviewed the patients History and Physical, chart, labs and discussed the procedure including the risks, benefits and alternatives for the proposed anesthesia with the patient or authorized representative who has indicated his/her understanding and acceptance.     Dental Advisory Given  Plan Discussed with: Anesthesiologist  Anesthesia Plan Comments: (Labs checked- platelets confirmed with RN in room. Fetal heart tracing, per RN, reported to be stable enough for sitting procedure. Discussed epidural, and patient consents to the procedure:  included risk of possible  headache,backache, failed block, allergic reaction, and nerve injury. This patient was asked if she had any questions or concerns before the procedure started.)        Anesthesia Quick Evaluation

## 2021-11-25 LAB — CBC
HCT: 20.9 % — ABNORMAL LOW (ref 36.0–46.0)
Hemoglobin: 6.4 g/dL — CL (ref 12.0–15.0)
MCH: 22.5 pg — ABNORMAL LOW (ref 26.0–34.0)
MCHC: 30.6 g/dL (ref 30.0–36.0)
MCV: 73.6 fL — ABNORMAL LOW (ref 80.0–100.0)
Platelets: 327 10*3/uL (ref 150–400)
RBC: 2.84 MIL/uL — ABNORMAL LOW (ref 3.87–5.11)
RDW: 17.9 % — ABNORMAL HIGH (ref 11.5–15.5)
WBC: 15.2 10*3/uL — ABNORMAL HIGH (ref 4.0–10.5)
nRBC: 0 % (ref 0.0–0.2)

## 2021-11-25 LAB — PREPARE RBC (CROSSMATCH)

## 2021-11-25 LAB — ABO/RH: ABO/RH(D): B POS

## 2021-11-25 MED ORDER — FUROSEMIDE 20 MG PO TABS
20.0000 mg | ORAL_TABLET | Freq: Every day | ORAL | Status: DC
  Administered 2021-11-25: 09:00:00 20 mg via ORAL
  Filled 2021-11-25: qty 1

## 2021-11-25 MED ORDER — NIFEDIPINE ER OSMOTIC RELEASE 30 MG PO TB24
30.0000 mg | ORAL_TABLET | Freq: Every day | ORAL | Status: DC
  Administered 2021-11-25: 16:00:00 30 mg via ORAL
  Filled 2021-11-25: qty 1

## 2021-11-25 MED ORDER — SODIUM CHLORIDE 0.9% IV SOLUTION: Freq: Once | INTRAVENOUS | Status: AC

## 2021-11-25 NOTE — Lactation Note (Signed)
This note was copied from a baby's chart. Lactation Consultation Note  Patient Name: Pamela Cisneros CVKFM'M Date: 11/25/2021 Reason for consult: Difficult latch;Mother's request;1st time breastfeeding;Term;Infant weight loss Age:21 hours Per mom, infant not sustaining latch with brief feedings. Mom was given hand pump to pre-pump breast prior to latching infant to help evert nipple shaft out more. Mom did breast stimulation and LC did suck training with infant on gloved finger to work with infant forming a ceil when latching. Mom latched infant on her infant on her left breast using the football hold, after few attempts infant started sustaining latch and BF for 14 minutes. Afterwards Mom  switched to right breast breast using the football hold, infant breastfeed for the  total  feeding for 22 minutes . Mom will continue to work on latching infant at the breast and will ask RN/LC for further latch assistance if needed. Mom was taught hand expression, LC used breast model and mom self expressed 5 mls of colostrum that was spoon feed to infant. RN will give mom breast shells to wear in bra, mom understands to wear them when she is not sleeping. Mom know to  continue breastfeed infant according to feeding cues, 8 to 12+ or more times within 24 hours, skin to skin.  Maternal Data    Feeding Mother's Current Feeding Choice: Breast Milk  LATCH Score Latch: Repeated attempts needed to sustain latch, nipple held in mouth throughout feeding, stimulation needed to elicit sucking reflex.  Audible Swallowing: A few with stimulation  Type of Nipple: Everted at rest and after stimulation  Comfort (Breast/Nipple): Soft / non-tender  Hold (Positioning): Assistance needed to correctly position infant at breast and maintain latch.  LATCH Score: 7   Lactation Tools Discussed/Used    Interventions Interventions: Assisted with latch;Skin to skin;Breast compression;Adjust position;Support  pillows;Position options;Expressed milk;Hand pump;Shells;Pre-pump if needed;Hand express;Education  Discharge    Consult Status Consult Status: Follow-up Date: 11/25/21 Follow-up type: In-patient    Danelle Earthly 11/25/2021, 3:39 AM

## 2021-11-25 NOTE — Progress Notes (Signed)
Date and time results received: 11/25/21 0549 (use smartphrase ".now" to insert current time)  Test: hgb Critical Value: 6.4  Name of Provider Notified: Dr. Annia Friendly  Orders Received? Or Actions Taken?:  Will see patient on rounds

## 2021-11-25 NOTE — Progress Notes (Signed)
POSTPARTUM PROGRESS NOTE  Post Partum Day 1  Subjective:  Pamela Cisneros is a 20 y.o. G1P1001 s/p VD at [redacted]w[redacted]d.  She reports she is doing well, but is feeling a little tired/lightheaded. No acute events overnight. She denies any problems with ambulating, voiding or po intake. Denies nausea or vomiting.  Pain is well controlled.  Lochia is mild.  Objective: Blood pressure 126/70, pulse (!) 105, temperature 97.8 F (36.6 C), temperature source Oral, resp. rate 18, height 5\' 4"  (1.626 m), weight 100.7 kg, last menstrual period 02/18/2021, SpO2 100 %, unknown if currently breastfeeding.  Physical Exam:  General: alert, cooperative and no distress Chest: no respiratory distress Heart:regular rate, distal pulses intact Uterine Fundus: firm, appropriately tender DVT Evaluation: No calf swelling or tenderness Extremities: minimal edema Skin: warm, dry  Recent Labs    11/24/21 0957 11/25/21 0509  HGB 8.6* 6.4*  HCT 28.4* 20.9*    Assessment/Plan: Pamela Cisneros is a 20 y.o. G1P1001 s/p VD at [redacted]w[redacted]d   PPD#1 - Doing well  Routine postpartum care  Acute blood loss anemia: Mildly symptomatic. Hgb 6.4 this am from 8.6. Plan for 2U RBC transfusion.   Gestational hypertension: BP currently WNL. Lasix x5 days to start this am. Will monitor.   Contraception: Undecided Feeding: breast Dispo: Plan for discharge tomorrow.   LOS: 2 days   [redacted]w[redacted]d, DO  OB Fellow  11/25/2021, 6:25 AM

## 2021-11-25 NOTE — Lactation Note (Addendum)
This note was copied from a baby's chart. Lactation Consultation Note  Patient Name: [name] NATFT'D Date: 11/25/2021 Reason for consult: Follow-up assessment;Difficult latch;Term;1st time breastfeeding;Primapara;Mother's request Age:21 hours  Mother to receive I unit PRBC- hgb 6.4 (EBL: 700)  LC in to assist P1 Mom with latching baby, baby is at 3% weight loss.  Mom concerned as she has been trying all night and baby is on and off the breast.  Now getting fussy.  Mom with compressible areola and semi-flat nipples.  Mom has a hand pump, but is doing well to manually evert nipples.  Hand expression for colostrum noted.  Baby laid prone on Mom's chest and unable to sustain a latch.  Mom sat up and positioned baby into football hold.  Baby unable to sustain a latch to the breast and becoming fussy.  Baby's mouth is small and she isn't opening very widely.  LC initiated a 20 mm nipple shield with instructions on how to properly apply.  Nipple pulled 1/3 into shield.  Baby latched after several attempts.  Baby needing stimulation to continue to suck.  Baby appeared to relax more and show some bursts of consistent sucking.  Unable to identify any swallows and nipple shield without colostrum.  LC set up DEBP and Mom will plan to pump both breast AFTER breastfeeding.   Talked to Mom about offering supplement of EBM+/donor breast milk into shield with next feeding.  RN aware of recommendation.  Plan- 1- Keep baby STS  2- Offer breast with cues, using nipple shield to help sustain a deep latch to breast 3-Pump both breasts on initiation setting 4- Offer any EBM back to baby via spoon or curved tip syringe into shield 5- If baby unable to become satisfied at the breast, no milk transfer evident, consider supplementation per volume guidelines.  Mom interested in a STORK pump.  RN entered request in Epic.  Mom to call prn for assistance.   LATCH Score Latch: Repeated attempts needed to  sustain latch, nipple held in mouth throughout feeding, stimulation needed to elicit sucking reflex.  Audible Swallowing: None  Type of Nipple: Flat  Comfort (Breast/Nipple): Soft / non-tender  Hold (Positioning): Assistance needed to correctly position infant at breast and maintain latch.  LATCH Score: 5   Lactation Tools Discussed/Used Tools: Pump;Flanges;Nipple Shields Nipple shield size: 20 Flange Size: 24 Breast pump type: Double-Electric Breast Pump;Manual Pump Education: Setup, frequency, and cleaning;Milk Storage Reason for Pumping: Support milk supply/nipple shield use Pumping frequency: Encouraged to pump after each feeding  Interventions Interventions: Breast feeding basics reviewed;Assisted with latch;Skin to skin;Breast massage;Hand express;Pre-pump if needed;Breast compression;Adjust position;Support pillows;Position options;Hand pump;DEBP  Discharge Pump: Personal;Stork Pump (Mom has hand's free pump at home.  RN to enter STORK pump)  Consult Status Consult Status: Follow-up Date: 11/26/21 Follow-up type: In-patient    Pamela Cisneros 11/25/2021, 9:12 AM

## 2021-11-26 LAB — TYPE AND SCREEN
ABO/RH(D): B POS
Antibody Screen: NEGATIVE
Unit division: 0
Unit division: 0

## 2021-11-26 LAB — BPAM RBC
Blood Product Expiration Date: 202301062359
Blood Product Expiration Date: 202301142359
ISSUE DATE / TIME: 202212300937
ISSUE DATE / TIME: 202212301249
Unit Type and Rh: 7300
Unit Type and Rh: 7300

## 2021-11-26 MED ORDER — NIFEDIPINE ER 30 MG PO TB24: 30.0000 mg | ORAL_TABLET | Freq: Every day | ORAL | 1 refills | Status: AC

## 2021-11-26 MED ORDER — FUROSEMIDE 20 MG PO TABS: 20.0000 mg | ORAL_TABLET | Freq: Every day | ORAL | 0 refills | Status: DC

## 2021-11-26 MED ORDER — ACETAMINOPHEN 325 MG PO TABS: 650.0000 mg | ORAL_TABLET | ORAL | 2 refills | Status: AC | PRN

## 2021-11-26 MED ORDER — IBUPROFEN 600 MG PO TABS
600.0000 mg | ORAL_TABLET | Freq: Four times a day (QID) | ORAL | 0 refills | Status: AC
Start: 2021-11-26 — End: ?

## 2021-11-26 NOTE — Progress Notes (Signed)
CSW met with MOB to complete consult for screams heard from room. CSW observed MOB breastfeeding infant, and female friend standing at bedside. MOB gave CSW verbal consent to complete consult while friend was present. CSW explained role, and reason for consult. MOB was pleasant, and polite during engagement with CSW. MOB reported, earlier her, FOB, and family had a disagreement about retrieving infant's items from storage. MOB reported, looking back she feels awful for her behavior during disagreement. However, after deliberation they decided that MOB friend's boyfriend will take FOB to retrieve infant's items from storage.  ° °CSW provided education regarding the baby blues period vs. perinatal mood disorders, discussed treatment and gave resources for mental health follow up if concerns arise. CSW recommends self- evaluation during the postpartum time period using the New Mom Checklist from Postpartum Progress and encouraged MOB to contact a medical professional if symptoms are noted at any time.  ° °MOB reported, since delivery she feels,"overwhelm, but over joy". MOB reported, FOB, and  her friend are very supportive. MOB denied SI, HI, and DV when CSW assessed for safety.  ° °CSW inform MOB of medicaid transportation, and cone transport if ever needed. MOB reported, she has all essentials needed to care for infant. MOB reported, infant's car seat, and bassinet will be retrieve from storage prior to discharge. MOB denied any additional barriers.   °  °CSW provided education on sudden infant death syndrome (SIDS). ° °CSW provided perinatal mood disorder resources.  ° °CSW provided community resource assistance guide.  ° °CSW identifies no further need for intervention or barriers to discharge at this time. ° °Salih Williamson, MSW, LCSW-A °Clinical Social Worker- Weekends °[phone]  °

## 2021-11-26 NOTE — Progress Notes (Signed)
At 1045 while at the nurses' station, RN heard scream coming from room.  RN went in to check on patient and she was in tears.  RN asked patient "If everything was okay?". Patient continued to sob and explained she was having difficulty with the father's family in getting baby's necessities from their storage unit.  Patient stated she was "stressed and frustrated."  RN listened to patient and validated her concerns.  RN called [facility] social work for a consult to further assist patient with social crisis and ensure safety and well being of both mom and baby in discharge.  Upon reassessment at 1125, patient had called friends to stay with her and baby and help dad go get baby's necessities from storage unit.  Awaiting social work consult at this time.

## 2021-11-26 NOTE — Lactation Note (Signed)
This note was copied from a baby's chart. Lactation Consultation Note  Patient Name: Pamela Cisneros Date: 11/26/2021 Reason for consult: Follow-up assessment;Difficult latch;Primapara;1st time breastfeeding;Term Age:21 hours  LC in to visit with P1 Mom of term baby.  Baby at 6% weight loss with adequate output.  Mom states feedings are going well.  Mom is latching baby to the breast and offering EBM/donor milk by bottle.and spoon.    Mom received her Medela PIS DEBP from [employer].  She has been pumping regularly, last pumping she expressed 15 ml colostrum total.   Baby cueing and offered to assist/assess baby at the breast.  Mom applied the 20 mm nipple shield. Baby having a tough time organizing her suck at the breast.  LC changed shield to a 24 mm for a better fit.  Baby latched and after a minute, became more consistent with her sucking deeply and swallowing identified.  Mom assisted to support her breast at base of breast and rolled blanket support placed under Mom's hand supporting baby's head.   Mom to pump both breasts 15-30 mins after breastfeeding and offer her EBM back to baby.    Engorgement prevention and treatment reviewed.  Recommended an OP lactation appt and message sent.to clinic.  Mom aware of OP lactation support and encouraged to call prn.   Feeding Nipple Type: Slow - flow  LATCH Score Latch: Grasps breast easily, tongue down, lips flanged, rhythmical sucking.  Audible Swallowing: Spontaneous and intermittent  Type of Nipple: Everted at rest and after stimulation  Comfort (Breast/Nipple): Soft / non-tender  Hold (Positioning): Assistance needed to correctly position infant at breast and maintain latch.  LATCH Score: 9   Lactation Tools Discussed/Used Tools: Pump;Flanges;Nipple Dorris Carnes;Bottle Nipple shield size: 24 Flange Size: 24 Breast pump type: Double-Electric Breast Pump Pumping frequency: Q 3 hrs Pumped volume: 15  mL  Interventions Interventions: Breast feeding basics reviewed;Assisted with latch;Skin to skin;Breast massage;Hand express;Breast compression;Adjust position;Support pillows;Position options;DEBP;Education  Discharge Discharge Education: Engorgement and breast care;Warning signs for feeding baby;Outpatient recommendation;Outpatient Epic message sent Pump: Stork Pump [facility] Program: Yes  Consult Status Consult Status: Complete Date: 11/26/21 Follow-up type: Call as needed    Pamela Cisneros 11/26/2021, 11:54 AM

## 2021-12-06 ENCOUNTER — Telehealth (HOSPITAL_COMMUNITY): Payer: Self-pay | Admitting: *Deleted

## 2021-12-06 NOTE — Telephone Encounter (Signed)
Patient reports experiencing a "stinging sensation on the lip of my vagina." Continued, "It feels like a paper cut." Reported the pain with sitting or standing. Denied discomfort with urination. Denied fever. Denied foul-smelling discharge. RN instructed patient to contact OB - patient verbalized understanding and a plan to call tomorrow. Patient voiced no other questions or concerns at this time. EPDS=5. Patient voiced no questions or concerns regarding infant at this time. Patient reports infant sleeps in a crib on her back. RN reviewed ABCs of safe sleep. Patient verbalized understanding. Patient requested RN email information on hospital's virtual postpartum classes and support groups. Email sent. Deforest Hoyles, RN, 12/06/21, [phone]

## 2021-12-08 ENCOUNTER — Ambulatory Visit (INDEPENDENT_AMBULATORY_CARE_PROVIDER_SITE_OTHER): Payer: Medicaid Other | Admitting: Obstetrics and Gynecology

## 2021-12-08 ENCOUNTER — Encounter: Payer: Self-pay | Admitting: Obstetrics and Gynecology

## 2021-12-08 VITALS — BP 134/91 | HR 76

## 2021-12-08 DIAGNOSIS — O135 Gestational [pregnancy-induced] hypertension without significant proteinuria, complicating the puerperium: Secondary | ICD-10-CM

## 2021-12-08 DIAGNOSIS — O139 Gestational [pregnancy-induced] hypertension without significant proteinuria, unspecified trimester: Secondary | ICD-10-CM

## 2021-12-08 MED ORDER — IBUPROFEN 800 MG PO TABS: 800.0000 mg | ORAL_TABLET | Freq: Three times a day (TID) | ORAL | 3 refills | Status: AC | PRN

## 2021-12-08 MED ORDER — LIDOCAINE HCL URETHRAL/MUCOSAL 2 % EX GEL: 1.0000 "application " | CUTANEOUS | 1 refills | Status: AC | PRN

## 2021-12-08 MED ORDER — OXYCODONE-ACETAMINOPHEN 5-325 MG PO TABS: 1.0000 | ORAL_TABLET | Freq: Four times a day (QID) | ORAL | 0 refills | Status: AC | PRN

## 2021-12-08 MED ORDER — OXYCODONE-ACETAMINOPHEN 5-325 MG PO TABS: 1.0000 | ORAL_TABLET | Freq: Four times a day (QID) | ORAL | 0 refills | Status: DC | PRN

## 2021-12-08 NOTE — Progress Notes (Signed)
Pamela Cisneros is here for BP check postpartum. See Note.  In addition she reports some difficulty with breastfeeding. Advised to follow up with lactation. Breastfeeding trouble shooting tips given. Infant is wetting and soiling diapers and has demonstrated weight gain.  Pamela Cisneros also reports perineal lacerations are still very painful and is concerned about recent increase in vaginal bleeding.  Patient placed on Dr. Waynard Edwards schedule for consult.

## 2021-12-08 NOTE — Progress Notes (Signed)
Patient ID: Pamela Cisneros, female   DOB: July 14, 2001, 21 y.o.   MRN: [id number] Ms Sledd presented for PP BP check. C/O perineal pain. Seen as a work in for further eval S/P TSVD on 11/24/21 with 2 degree laceration, left labial laceration and periureteral laceration. Pt reports left side pelvic pain since delivery. No fever or chills. Denies any bowel or bladder dysfunction No HA or visual changes  PE AF  VSS Chaperone present during exam  Lungs clear Heart RRR Abd soft + BS U-3 GU healing lacerations, no evidence of infection or break down  A/P PP check        Perineal pain        GHTN  Pt reassure that perineal is healing well. Will refill Motrin, Percocet as needed, Lidocaine jelly as needed. Instructed on use of sitz baths. Continue with Procardia. F/U with routine PP visit.

## 2021-12-08 NOTE — Patient Instructions (Signed)
Postpartum Care After Vaginal Delivery The following information offers guidance about how to care for yourself from the time you deliver your baby to 6-12 weeks after delivery (postpartum period). If you have problems or questions, contact your health care provider for more specific instructions. Follow these instructions at home: Vaginal bleeding It is normal to have vaginal bleeding (lochia) after delivery. Wear a sanitary pad for bleeding and discharge. During the first week after delivery, the amount and appearance of lochia is often similar to a menstrual period. Over the next few weeks, it will gradually decrease to a dry, yellow-brown discharge. For most women, lochia stops completely by 4-6 weeks after delivery, but can vary. Change your sanitary pads frequently. Watch for any changes in your flow, such as: A sudden increase in volume. A change in color. Large blood clots. If you pass a blood clot from your vagina, save it and call your health care provider. Do not flush blood clots down the toilet before talking with your health care provider. Do not use tampons or douches until your health care provider approves. If you are not breastfeeding, your period should return 6-8 weeks after delivery. If you are feeding your baby breast milk only, your period may not return until you stop breastfeeding. Perineal care  Keep the area between the vagina and the anus (perineum) clean and dry. Use medicated pads and pain-relieving sprays and creams as directed. If you had a surgical cut in the perineum (episiotomy) or a tear, check the area for signs of infection until you are healed. Check for: More redness, swelling, or pain. Fluid or blood coming from the cut or tear. Warmth. Pus or a bad smell. You may be given a squirt bottle to use instead of wiping to clean the perineum area after you use the bathroom. Pat the area gently to dry it. To relieve pain caused by an episiotomy, a tear, or  swollen veins in the anus (hemorrhoids), take a warm sitz bath 2-3 times a day. In a sitz bath, the warm water should only come up to your hips and cover your buttocks. Breast care In the first few days after delivery, your breasts may feel heavy, full, and uncomfortable (breast engorgement). Milk may also leak from your breasts. Ask your health care provider about ways to help relieve the discomfort. If you are breastfeeding: Wear a bra that supports your breasts and fits well. Use breast pads to absorb milk that leaks. Keep your nipples clean and dry. Apply creams and ointments as told. You may have uterine contractions every time you breastfeed for up to several weeks after delivery. This helps your uterus return to its normal size. If you have any problems with breastfeeding, notify your health care provider or lactation consultant. If you are not breastfeeding: Avoid touching your breasts. Do not squeeze out (express) milk. Doing this can make your breasts produce more milk. Wear a good-fitting bra and use cold packs to help with swelling. Intimacy and sexuality Ask your health care provider when you can engage in sexual activity. This may depend upon: Your risk of infection. How fast you are healing. Your comfort and desire to engage in sexual activity. You are able to get pregnant after delivery, even if you have not had your period. Talk with your health care provider about methods of birth control (contraception) or family planning if you desire future pregnancies. Medicines Take over-the-counter and prescription medicines only as told by your health care provider. Take an  not had your period. Talk with your health care provider about methods of birth control (contraception) or family planning if you desire future pregnancies.  Medicines  Take over-the-counter and prescription medicines only as told by your health care provider.  Take an over-the-counter stool softener to help ease bowel movements as told by your health care provider.  If you were prescribed an antibiotic medicine, take it as told by your health care provider. Do not stop taking the antibiotic even if you start to feel better.  Review all previous and current prescriptions to check for  possible transfer into breast milk.  Activity  Gradually return to your normal activities as told by your health care provider.  Rest as much as possible. Nap while your baby is sleeping.  Eating and drinking    Drink enough fluid to keep your urine pale yellow.  To help prevent or relieve constipation, eat high-fiber foods every day.  Choose healthy eating to support breastfeeding or weight loss goals.  Take your prenatal vitamins until your health care provider tells you to stop.  General tips/recommendations  Do not use any products that contain nicotine or tobacco. These products include cigarettes, chewing tobacco, and vaping devices, such as e-cigarettes. If you need help quitting, ask your health care provider.  Do not drink alcohol, especially if you are breastfeeding.  Do not take medications or drugs that are not prescribed to you, especially if you are breastfeeding.  Visit your health care provider for a postpartum checkup within the first 3-6 weeks after delivery.  Complete a comprehensive postpartum visit no later than 12 weeks after delivery.  Keep all follow-up visits for you and your baby.  Contact a health care provider if:  You feel unusually sad or worried.  Your breasts become red, painful, or hard.  You have a fever or other signs of an infection.  You have bleeding that is soaking through one pad an hour or you have blood clots.  You have a severe headache that doesn't go away or you have vision changes.  You have nausea and vomiting and are unable to eat or drink anything for 24 hours.  Get help right away if:  You have chest pain or difficulty breathing.  You have sudden, severe leg pain.  You faint or have a seizure.  You have thoughts about hurting yourself or your baby.  If you ever feel like you may hurt yourself or others, or have thoughts about taking your own life, get help right away. Go to your nearest emergency department or:  Call your local emergency services (911 in the  [place]  The National Suicide Prevention Lifeline at 1-800-273-8255 or 988 in the [place] This suicide crisis helpline is open 24 hours a day.  Text the Crisis Text Line at 741741 (in the [place]  Summary  The period of time after you deliver your newborn up to 6-12 weeks after delivery is called the postpartum period.  Keep all follow-up visits for you and your baby.  Review all previous and current prescriptions to check for possible transfer into breast milk.  Contact a health care provider if you feel unusually sad or worried during the postpartum period.  This information is not intended to replace advice given to you by your health care provider. Make sure you discuss any questions you have with your health care provider.  Document Revised: 06/08/2021 Document Reviewed: 07/29/2020  Elsevier Patient Education  2022 [employer]

## 2021-12-08 NOTE — Progress Notes (Signed)
Subjective:  Pamela Cisneros is a 21 y.o. female here for BP check.   Hypertension ROS: taking medications as instructed, no medication side effects noted, no TIA's, no chest pain on exertion, no dyspnea on exertion, and no swelling of ankles.    Objective:  LMP 02/18/2021   Appearance alert, well appearing, and in no distress. General exam BP noted to be well controlled today in office.    Assessment:   Blood Pressure stable.   Plan:  Current treatment plan is effective, no change in therapy.Marland Kitchen

## 2021-12-08 NOTE — Addendum Note (Signed)
Addended by: Hermina Staggers on: 12/08/2021 05:01 PM   Modules accepted: Orders

## 2021-12-26 ENCOUNTER — Ambulatory Visit: Payer: Medicaid Other | Admitting: Obstetrics and Gynecology

## 2022-01-18 ENCOUNTER — Encounter: Payer: Self-pay | Admitting: Obstetrics

## 2022-01-18 ENCOUNTER — Ambulatory Visit (INDEPENDENT_AMBULATORY_CARE_PROVIDER_SITE_OTHER): Payer: Medicaid Other | Admitting: Obstetrics

## 2022-01-18 ENCOUNTER — Other Ambulatory Visit: Payer: Self-pay

## 2022-01-18 DIAGNOSIS — Z30011 Encounter for initial prescription of contraceptive pills: Secondary | ICD-10-CM

## 2022-01-18 DIAGNOSIS — Z3009 Encounter for other general counseling and advice on contraception: Secondary | ICD-10-CM

## 2022-01-18 MED ORDER — LEVONORGESTREL-ETHINYL ESTRAD 0.15-30 MG-MCG PO TABS: 1.0000 | ORAL_TABLET | Freq: Every day | ORAL | 11 refills | Status: DC

## 2022-01-18 NOTE — Progress Notes (Signed)
° ° °  Error.  Shelly Bombard, MD 01/18/2022 12:13 PM

## 2022-01-18 NOTE — Progress Notes (Signed)
Post Partum Visit Note  Pamela Cisneros is a 21 y.o. [id number] female who presents for a postpartum visit. She is 8 week postpartum following a normal spontaneous vaginal delivery.  I have fully reviewed the prenatal and intrapartum course. The delivery was at 39 gestational weeks.  Anesthesia: epidural. Postpartum course has been unremarkable. Baby is doing well. Baby is feeding by bottle Rush Barer good start gentle . Bleeding  started period . Bowel function is normal. Bladder function is normal. Patient is not sexually active. Contraception method is none. Postpartum depression screening: negative.   Upstream - 01/18/22 1103       Pregnancy Intention Screening   Does the patient want to become pregnant in the next year? No    Does the patient's partner want to become pregnant in the next year? No    Would the patient like to discuss contraceptive options today? Yes            The pregnancy intention screening data noted above was reviewed. Potential methods of contraception were discussed. The patient elected to proceed with No data recorded.   Edinburgh Postnatal Depression Scale - 01/18/22 1100       Edinburgh Postnatal Depression Scale:  In the Past 7 Days   I have been able to laugh and see the funny side of things. 0    I have looked forward with enjoyment to things. 0    I have blamed myself unnecessarily when things went wrong. 2    I have been anxious or worried for no good reason. 1    I have felt scared or panicky for no good reason. 1    Things have been getting on top of me. 1    I have been so unhappy that I have had difficulty sleeping. 0    I have felt sad or miserable. 0    I have been so unhappy that I have been crying. 0    The thought of harming myself has occurred to me. 0    Edinburgh Postnatal Depression Scale Total 5             Health Maintenance Due  Topic Date Due   COVID-19 Vaccine (1) Never done   HPV VACCINES (1 - 2-dose series) Never done    PAP-Cervical Cytology Screening  Never done   PAP SMEAR-Modifier  Never done    The following portions of the patient's history were reviewed and updated as appropriate: allergies, current medications, past family history, past medical history, past social history, past surgical history, and problem list.  Review of Systems A comprehensive review of systems was negative.  Objective:  BP 125/80    Pulse 80    Wt 202 lb 8 oz (91.9 kg)    LMP 01/10/2022 (Exact Date)    Breastfeeding No    BMI 34.76 kg/m    General:  alert and no distress   Breasts:  normal  Lungs: clear to auscultation bilaterally  Heart:  regular rate and rhythm, S1, S2 normal, no murmur, click, rub or gallop  Abdomen: soft, non-tender; bowel sounds normal; no masses,  no organomegaly   Wound none  GU exam:  not indicated       Assessment:    1. Postpartum care following vaginal delivery  2. Encounter for other general counseling and advice on contraception - wants OCP's  3. Encounter for initial prescription of contraceptive pills Rx: - levonorgestrel-ethinyl estradiol (NORDETTE) 0.15-30 MG-MCG tablet; Take 1  tablet by mouth daily.  Dispense: 28 tablet; Refill: 11    Plan:   Essential components of care per ACOG recommendations:  1.  Mood and well being: Patient with negative depression screening today. Reviewed local resources for support.  - Patient tobacco use? No.   - hx of drug use? No.    2. Infant care and feeding:  -Patient currently breastmilk feeding? No.  -Social determinants of health (SDOH) reviewed in EPIC. No concerns 3. Sexuality, contraception and birth spacing - Patient does not want a pregnancy in the next year.  Desired family size is 3 children.  - Reviewed forms of contraception in tiered fashion. Patient desired oral contraceptives (estrogen/progesterone) today.   - Discussed birth spacing of 18 months  4. Sleep and fatigue -Encouraged family/partner/community support of 4  hrs of uninterrupted sleep to help with mood and fatigue  5. Physical Recovery  - Discussed patients delivery and complications. She describes her labor as good. - Patient had a Vaginal, no problems at delivery. Patient had a 2nd degree laceration. Perineal healing reviewed. Patient expressed understanding - Patient has urinary incontinence? Yes. Discussed role of pelvic floor PT. - Patient is safe to resume physical and sexual activity  6.  Health Maintenance - HM due items addressed Yes - Last pap smear No results found for: DIAGPAP Pap smear not done at today's visit.  -Breast Cancer screening indicated? No.   7. Chronic Disease/Pregnancy Condition follow up: None   Baltazar Najjar, [place] for [facility], [place], [place] 01/18/22

## 2022-02-07 ENCOUNTER — Ambulatory Visit: Payer: Medicaid Other | Admitting: Advanced Practice Midwife

## 2022-02-15 ENCOUNTER — Telehealth: Payer: Self-pay

## 2022-02-15 NOTE — Telephone Encounter (Signed)
Attempted to return call, no answer, unable to leave vm. 

## 2022-03-14 ENCOUNTER — Ambulatory Visit: Payer: Medicaid Other

## 2022-04-08 ENCOUNTER — Emergency Department (HOSPITAL_COMMUNITY)
Admission: EM | Admit: 2022-04-08 | Discharge: 2022-04-08 | Disposition: A | Discharge status: Home / Self Care | Payer: Medicaid Other | Attending: Emergency Medicine | Admitting: Emergency Medicine

## 2022-04-08 ENCOUNTER — Other Ambulatory Visit: Payer: Self-pay

## 2022-04-08 ENCOUNTER — Encounter (HOSPITAL_COMMUNITY): Payer: Self-pay

## 2022-04-08 DIAGNOSIS — B084 Enteroviral vesicular stomatitis with exanthem: Secondary | ICD-10-CM | POA: Insufficient documentation

## 2022-04-08 DIAGNOSIS — R21 Rash and other nonspecific skin eruption: Secondary | ICD-10-CM | POA: Diagnosis present

## 2022-04-08 MED ORDER — LIDOCAINE 4 % EX CREA
TOPICAL_CREAM | Freq: Once | CUTANEOUS | Status: AC
  Administered 2022-04-08: 1 via TOPICAL
  Filled 2022-04-08: qty 5

## 2022-04-08 MED ORDER — IBUPROFEN 200 MG PO TABS
600.0000 mg | ORAL_TABLET | Freq: Once | ORAL | Status: AC
Start: 2022-04-08 — End: 2022-04-08
  Administered 2022-04-08: 600 mg via ORAL
  Filled 2022-04-08: qty 1

## 2022-04-08 NOTE — ED Provider Notes (Signed)
?[facility] ?Provider Note ? ? ?CSN: [id number] ?Arrival date & time: 04/08/22  1401 ? ?  ? ?History ? ?Chief Complaint  ?Patient presents with  ? Rash  ? ? ?Pamela Cisneros is a 21 y.o. female. ? ?Patient presents with rash on hands and feet that is itchy and burning.  Painful and burning sensation.  Patient denies any new contact of foods and only allergic to penicillin.  Patient's young child does have a small rash has similarities.  Patient unsure if she had hand-foot-and-mouth disease as a child.  No fevers.  Tolerating oral liquids. ? ? ?  ? ?Home Medications ?Prior to Admission medications   ?Medication Sig Start Date End Date Taking? Authorizing Provider  ?acetaminophen (TYLENOL) 325 MG tablet Take 2 tablets (650 mg total) by mouth every 4 (four) hours as needed (for pain scale < 4). 11/26/21   Caren Macadam, MD  ?Blood Pressure Monitoring (BLOOD PRESSURE KIT) DEVI 1 kit by Does not apply route once a week. 04/20/21   Chancy Milroy, MD  ?ferrous sulfate 325 (65 FE) MG tablet Take 1 tablet (325 mg total) by mouth every other day. 09/07/21   Renee Harder, CNM  ?ibuprofen (ADVIL) 600 MG tablet Take 1 tablet (600 mg total) by mouth every 6 (six) hours. 11/26/21   Caren Macadam, MD  ?ibuprofen (ADVIL) 800 MG tablet Take 1 tablet (800 mg total) by mouth 3 (three) times daily with meals as needed for headache or moderate pain. 12/08/21   Chancy Milroy, MD  ?levonorgestrel-ethinyl estradiol (NORDETTE) 0.15-30 MG-MCG tablet Take 1 tablet by mouth daily. 01/18/22   Shelly Bombard, MD  ?lidocaine (XYLOCAINE) 2 % jelly Apply 1 application topically as needed. 12/08/21   Chancy Milroy, MD  ?NIFEdipine (ADALAT CC) 30 MG 24 hr tablet Take 1 tablet (30 mg total) by mouth daily. 11/26/21   Caren Macadam, MD  ?oxyCODONE-acetaminophen (PERCOCET/ROXICET) 5-325 MG tablet Take 1-2 tablets by mouth every 6 (six) hours as needed. 12/08/21   Chancy Milroy, MD  ?Prenatal MV & Min w/FA-DHA (PRENATAL GUMMIES) 0.18-25 MG CHEW Chew by mouth.    [provider]  ?   ? ?Allergies    ?Penicillins   ? ?Review of Systems   ?Review of Systems  ?Constitutional:  Negative for chills and fever.  ?HENT:  Negative for congestion.   ?Eyes:  Negative for visual disturbance.  ?Respiratory:  Negative for shortness of breath.   ?Cardiovascular:  Negative for chest pain.  ?Gastrointestinal:  Negative for abdominal pain and vomiting.  ?Genitourinary:  Negative for dysuria and flank pain.  ?Musculoskeletal:  Negative for back pain, neck pain and neck stiffness.  ?Skin:  Positive for rash.  ?Neurological:  Negative for light-headedness and headaches.  ? ?Physical Exam ?Updated Vital Signs ?BP 136/81   Pulse 81   Temp 98.3 ?F (36.8 ?C) (Oral)   Resp 18   Ht 5' 4"  (1.626 m)   Wt 90.7 kg   SpO2 100%   BMI 34.33 kg/m?  ?Physical Exam ?Vitals and nursing note reviewed.  ?Constitutional:   ?   General: She is not in acute distress. ?   Appearance: She is well-developed.  ?HENT:  ?   Head: Normocephalic and atraumatic.  ?   Mouth/Throat:  ?   Mouth: Mucous membranes are moist.  ?Eyes:  ?   General:     ?   Right eye: No discharge.     ?  Left eye: No discharge.  ?   Conjunctiva/sclera: Conjunctivae normal.  ?Neck:  ?   Trachea: No tracheal deviation.  ?Cardiovascular:  ?   Rate and Rhythm: Normal rate.  ?Pulmonary:  ?   Effort: Pulmonary effort is normal.  ?Abdominal:  ?   General: There is no distension.  ?   Tenderness: There is no guarding.  ?Musculoskeletal:  ?   Cervical back: Normal range of motion and neck supple. No rigidity.  ?Skin: ?   General: Skin is warm.  ?   Capillary Refill: Capillary refill takes less than 2 seconds.  ?   Findings: Rash present.  ?   Comments: Patient has multiple macules and small ulcerations palms and in between digits both hands.  Patient macules plantar surface of feet.  ?Neurological:  ?   General: No focal deficit present.  ?   Mental  Status: She is alert.  ?Psychiatric:     ?   Mood and Affect: Mood normal.  ? ? ?ED Results / Procedures / Treatments   ?Labs ?(all labs ordered are listed, but only abnormal results are displayed) ?Labs Reviewed - No data to display ? ?EKG ?None ? ?Radiology ?No results found. ? ?Procedures ?Procedures  ? ? ?Medications Ordered in ED ?Medications  ?ibuprofen (ADVIL) tablet 600 mg (has no administration in time range)  ?lidocaine (LMX) 4 % cream (has no administration in time range)  ? ? ?ED Course/ Medical Decision Making/ [name] ?  ?                        ?Medical Decision Making ?Risk ?OTC drugs. ? ? ?Patient presents with worsening rash hands and feet most clinical concern for hand-foot-and-mouth disease especially with young child having similar at this time.  Patient is healthy otherwise, well-appearing, afebrile.  No concern for serious bacterial infection/STD related or other at this time.  Follow-up discussed, lidocaine topical and ibuprofen given for pain.  Patient comfortable this plan.  Patient and child will try to stay away from other people the next week. ? ? ? ? ? ? ? ?Final Clinical Impression(s) / ED Diagnoses ?Final diagnoses:  ?Hand, foot and mouth disease  ? ? ?Rx / DC Orders ?ED Discharge Orders   ? ? None  ? ?  ? ? ?  ?Elnora Morrison, MD ?04/08/22 1612 ? ?

## 2022-04-08 NOTE — ED Triage Notes (Addendum)
Pt arrived POV from home c/o a rash that started on her hands yesterday morning and now has spread to her arms and legs. Pt states the bumps itch and burn. Pt denies any contact with any new substances or foods and states she is only allergic to penicillins that she knows of.  ?

## 2022-04-08 NOTE — Discharge Instructions (Addendum)
Use Tylenol every 4 hours and ibuprofen every 6 hours as needed for pain.  You can use topical lidocaine twice a day to help with pain.  Use cool/ice water as well.  Return for persistent fevers, spreading redness up the hands or legs or new concerns. ? ?

## 2022-04-08 NOTE — ED Notes (Signed)
Reviewed discharge instructions with patient. Follow-up care and medications reviewed. Patient  verbalized understanding. Patient A&Ox4, VSS, and ambulatory with steady gait upon discharge.  °

## 2022-09-10 IMAGING — US US MFM OB DETAIL+14 WK
1 series · 13 of 28 positions shown · non-contrast
Comparison: none

[Series 1: us mfm ob detail+14 wk · 130 acquisitions, 13 frames shown]
[im 5/130]
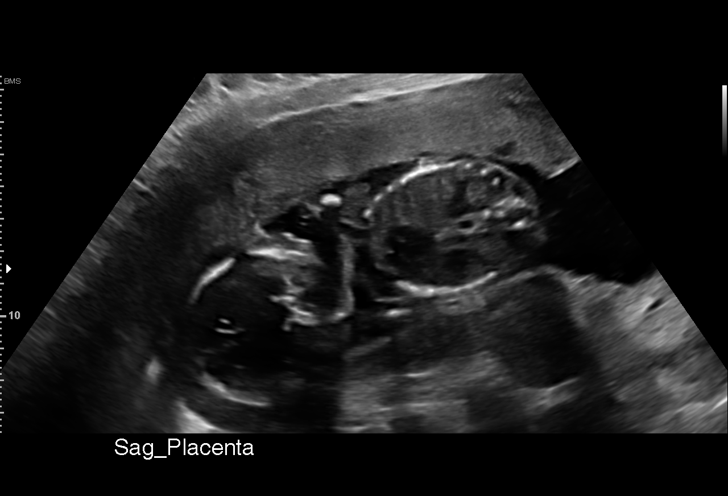
[im 15/130]
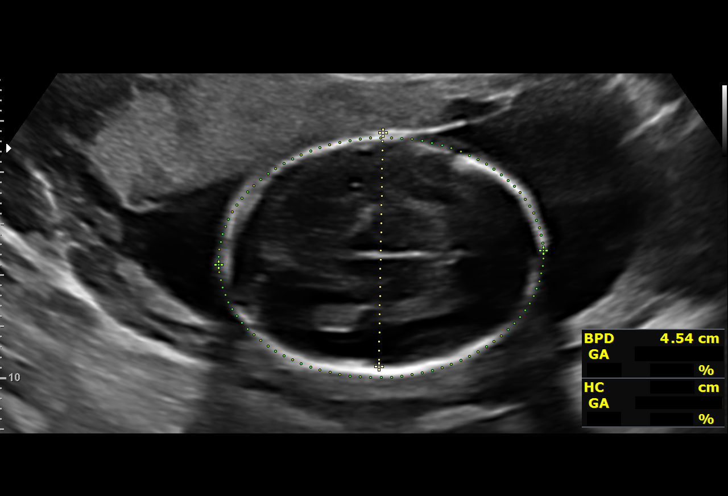
[im 24/130]
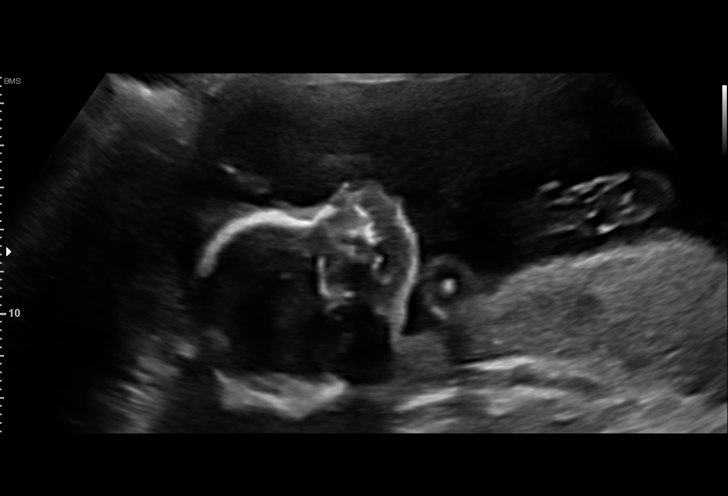
[im 34/130]
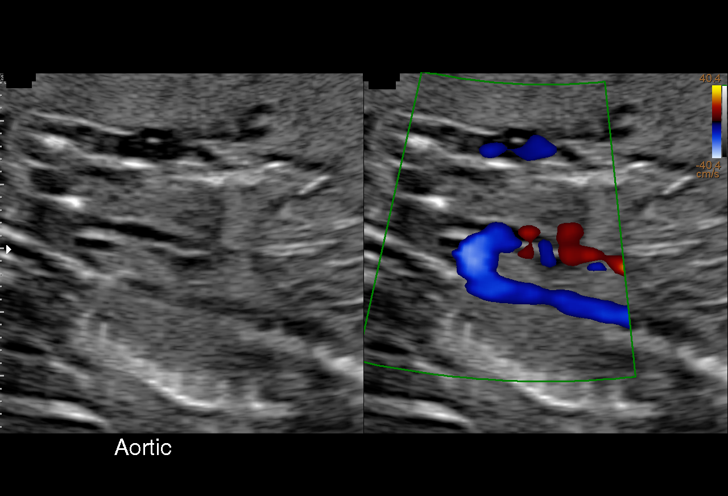
[im 44/130]
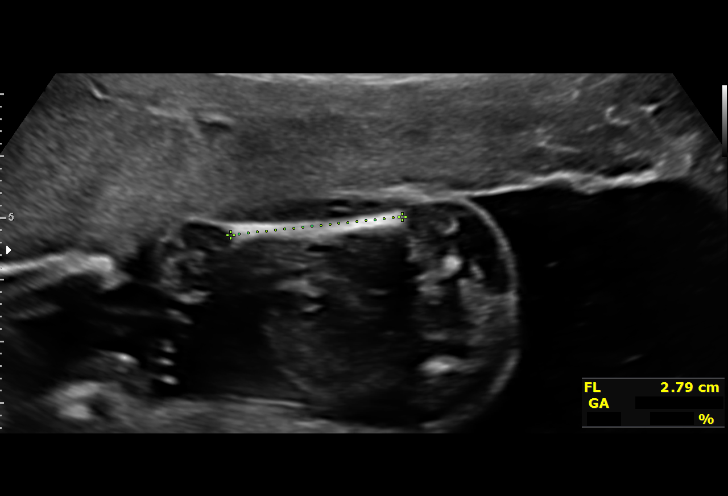
[im 53/130]
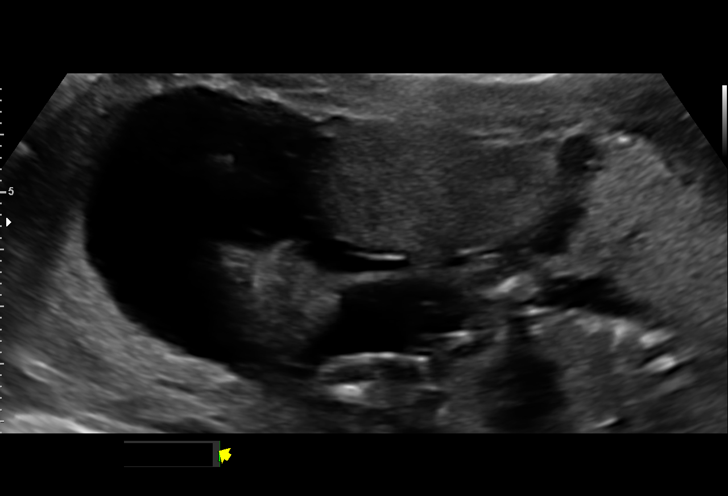
[im 67/130]
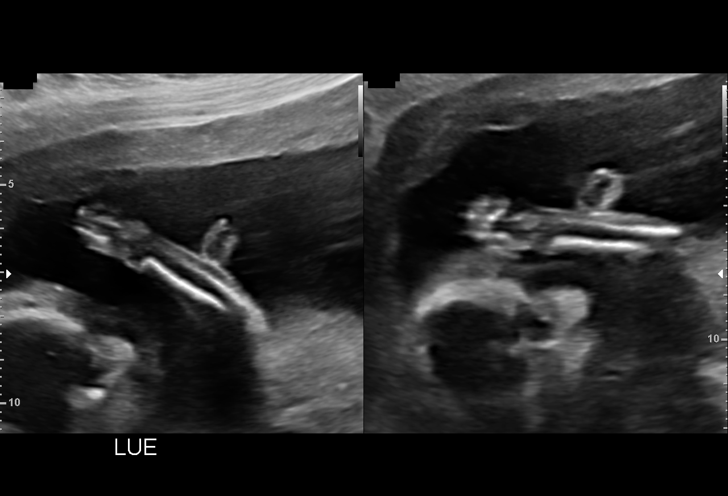
[im 77/130]
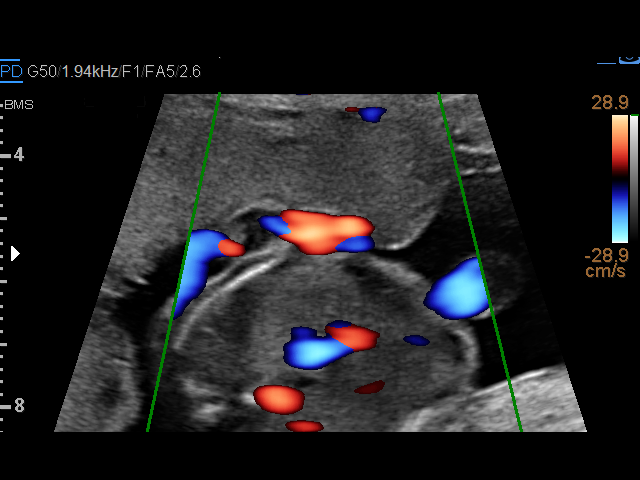
[im 87/130]
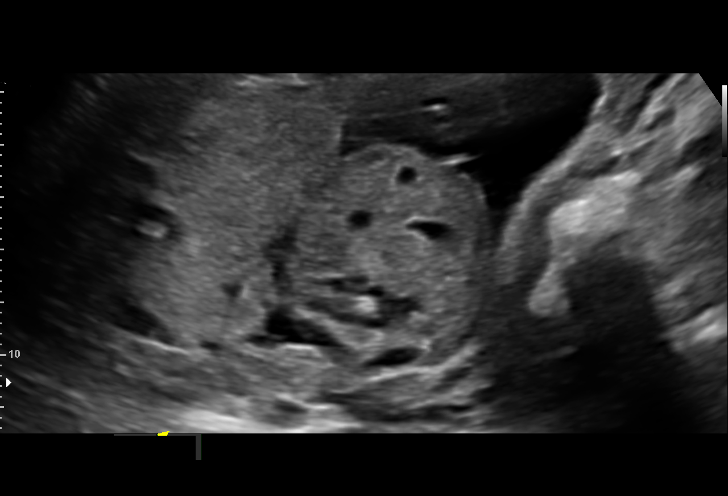
[im 96/130]
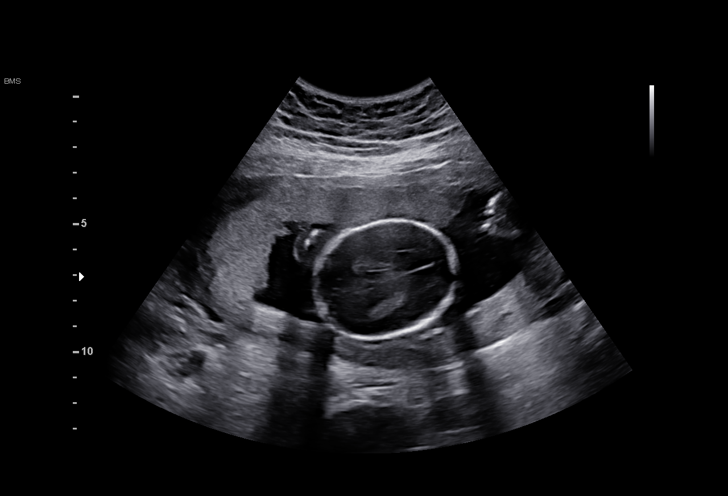
[im 106/130]
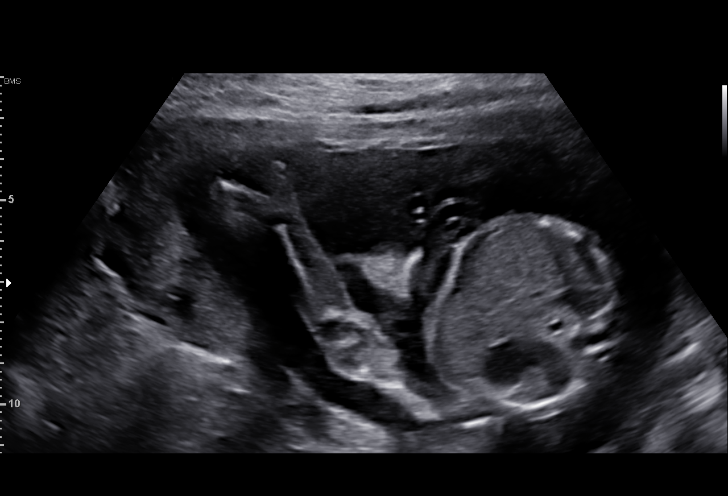
[im 115/130]
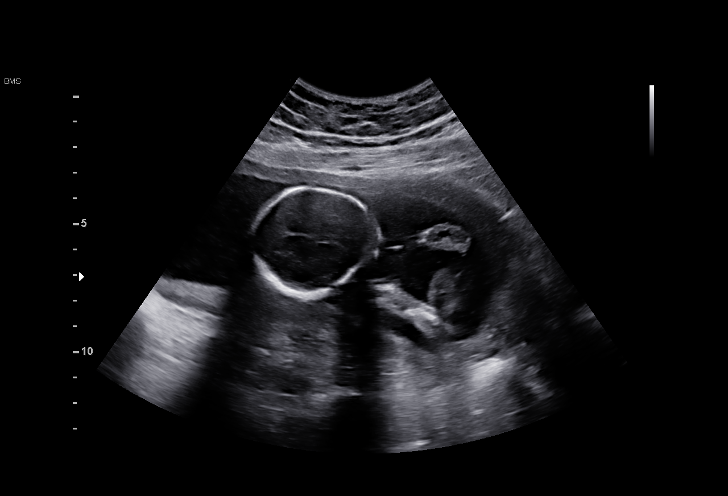
[im 125/130]
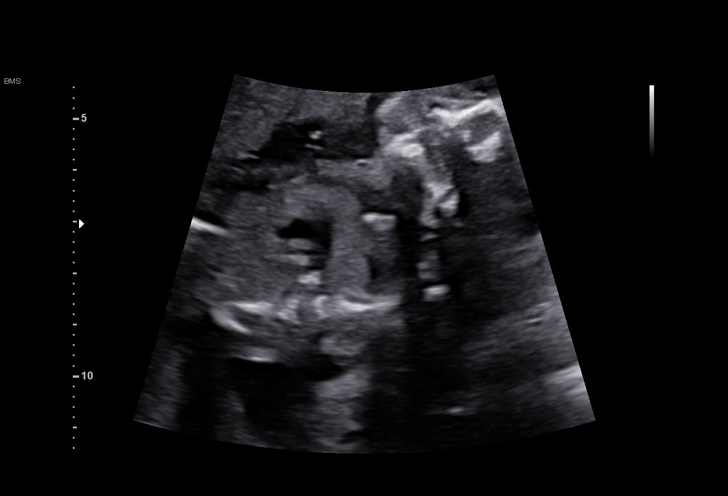

[13 of 28 positions shown; findings below may reference images not displayed]

Indications

 Obesity complicating pregnancy, second
 trimester (pregravid BMI 35)
 19 weeks gestation of pregnancy
 Encounter for antenatal screening for
 malformations
 LR NIPS, Neg Horizon
Fetal Evaluation

 Num Of Fetuses:          1
 Fetal Heart Rate(bpm):   140
 Cardiac Activity:        Observed
 Presentation:            Breech
 Placenta:                Anterior
 P. Cord Insertion:       Visualized, central

 Amniotic Fluid
 AFI FV:      Within normal limits

                             Largest Pocket(cm)

Biometry

 BPD:      45.7  mm     G. Age:  19w 6d         73  %    CI:        69.83   %    70 - 86
                                                         FL/HC:       16.4  %    16.1 -
 HC:      174.5  mm     G. Age:  20w 0d         75  %    HC/AC:       1.28       1.09 -
 AC:      136.1  mm     G. Age:  19w 0d         37  %    FL/BPD:      62.6  %
 FL:       28.6  mm     G. Age:  18w 5d         25  %    FL/AC:       21.0  %    20 - 24
 CER:      19.3  mm     G. Age:  18w 6d         29  %
 NFT:       3.7  mm

 LV:        6.4  mm
 CM:        5.3  mm

 Est. FW:     271   gm   0 lb 10 oz      32  %
OB History

 Gravidity:    1
 Living:       0
Gestational Age

 LMP:           19w 4d        Date:  02/18/21                 EDD:   11/25/21
 U/S Today:     19w 3d                                        EDD:   11/26/21
 Best:          19w 2d     Det. By:  U/S C R L  (04/20/21)    EDD:   11/27/21
Anatomy

 Cranium:               Appears normal         LVOT:                   Appears normal
 Cavum:                 Appears normal         Aortic Arch:            Appears normal
 Ventricles:            Appears normal         Ductal Arch:            Not well visualized
 Choroid Plexus:        Appears normal         Diaphragm:              Appears normal
 Cerebellum:            Appears normal         Stomach:                Appears normal, left
                                                                       sided
 Posterior Fossa:       Appears normal         Abdomen:                Appears normal
 Nuchal Fold:           Appears normal         Abdominal Wall:         Appears nml (cord
                                                                       insert, abd wall)
 Face:                  Appears normal         Cord Vessels:           Appears normal (3
                        (orbits and profile)                           vessel cord)
 Lips:                  Appears normal         Kidneys:                Appear normal
 Palate:                Not well visualized    Bladder:                Appears normal
 Thoracic:              Appears normal         Spine:                  Appears normal
 Heart:                 Appears normal         Upper Extremities:      Appears normal
                        (4CH, axis, and
                        situs)
 RVOT:                  Appears normal         Lower Extremities:      Appears normal

 Other:  Heels/feet and open hands/5th digits visualized. Nasal bone
         visualized. Fetus appears to be female.
Cervix Uterus Adnexa

 Cervix
 Length:           3.13  cm.
 Normal appearance by transabdominal scan.

 Uterus
 No abnormality visualized.

 Right Ovary
 Not visualized.

 Left Ovary
 Not visualized.
 Cul De Sac
 No free fluid seen.

 Adnexa
 No adnexal mass visualized.
Comments

 This patient was seen for a detailed fetal anatomy scan due
 to maternal obesity.
 She denies any significant past medical history and denies
 any problems in her current pregnancy.
 She had a cell free DNA test earlier in her pregnancy which
 indicated a low risk for trisomy 21, 18, and 13. A female fetus
 is predicted.
 She was informed that the fetal growth and amniotic fluid
 level were appropriate for her gestational age.
 There were no obvious fetal anomalies noted on today's
 ultrasound exam.  However, the views of the fetal anatomy
 were limited today due to the fetal position.
 The patient was informed that anomalies may be missed due
 to technical limitations. If the fetus is in a suboptimal position
 or maternal habitus is increased, visualization of the fetus in
 the maternal uterus may be impaired.
 A follow-up exam was scheduled in 4 weeks to complete the
 views of the fetal anatomy.

## 2022-10-08 IMAGING — US US MFM OB FOLLOW-UP
1 series · 13 of 28 positions shown · non-contrast
Comparison: none

[Series 1: us mfm ob follow-up · 13 of 74 slices shown]
[im 3/74]
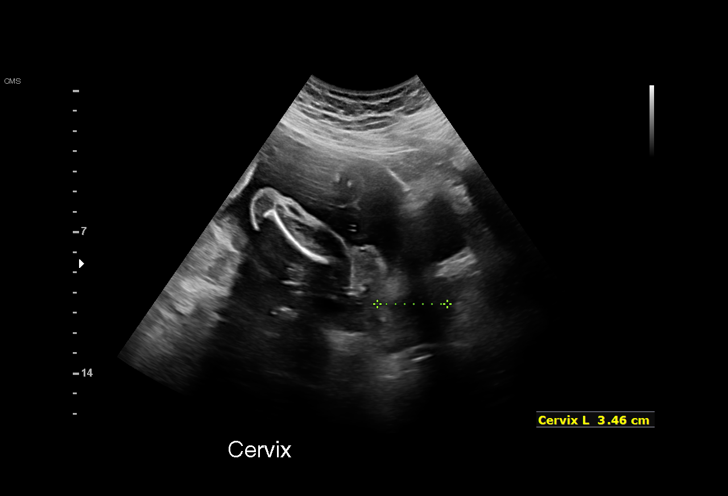
[im 9/74]
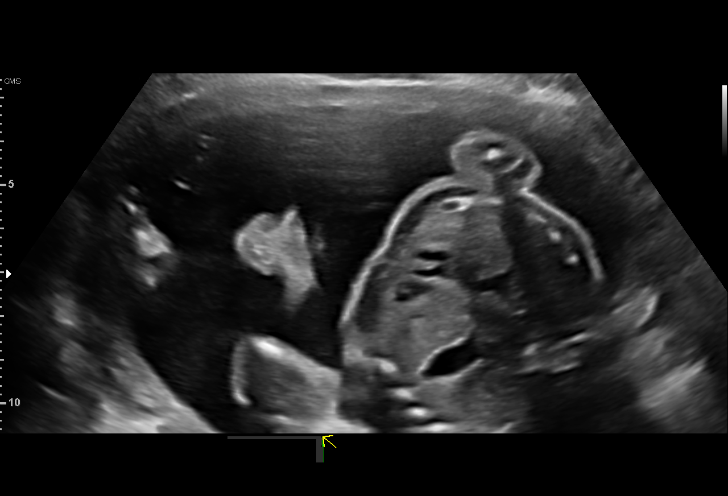
[im 14/74]
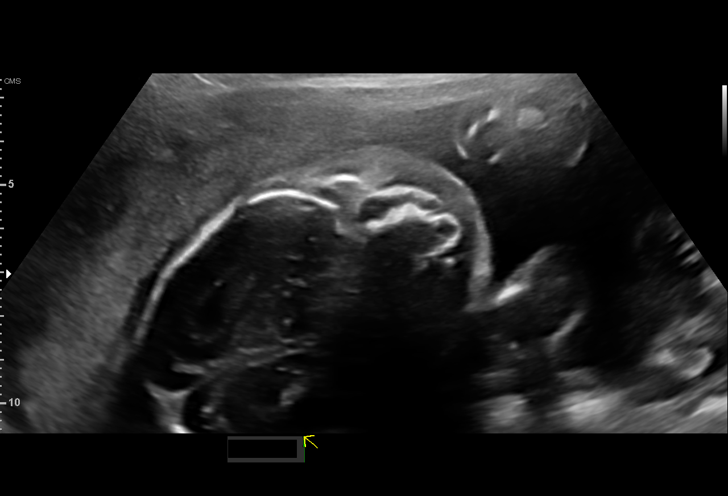
[im 19/74]
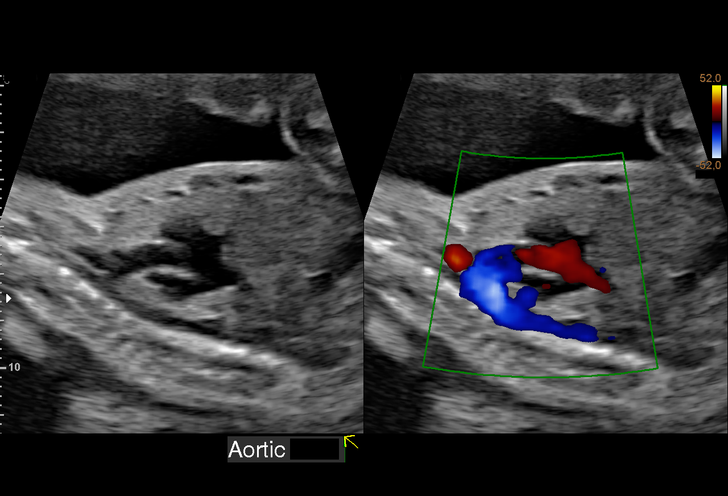
[im 25/74]
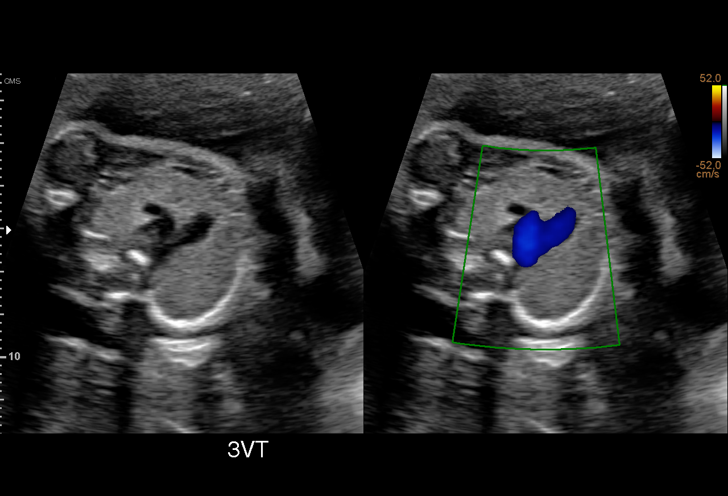
[im 30/74]
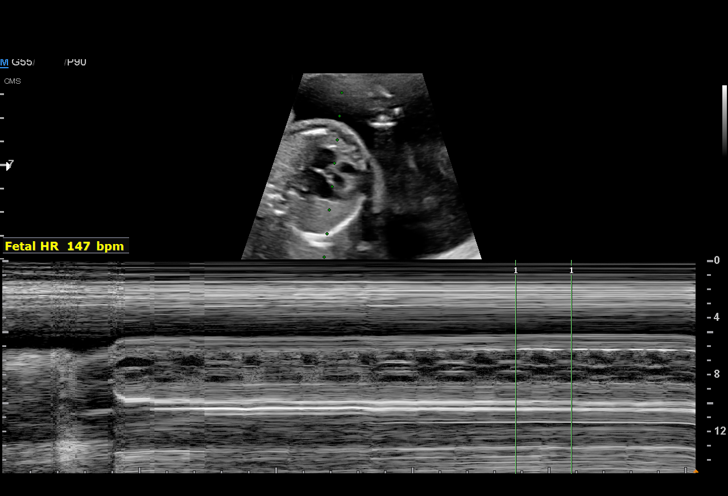
[im 38/74]
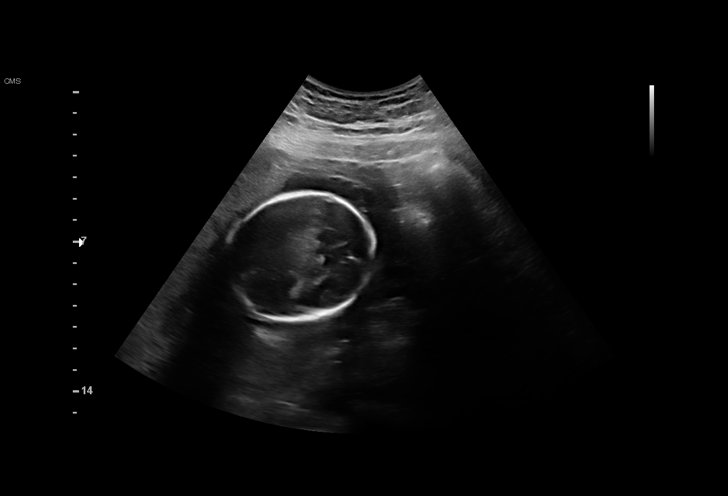
[im 44/74]
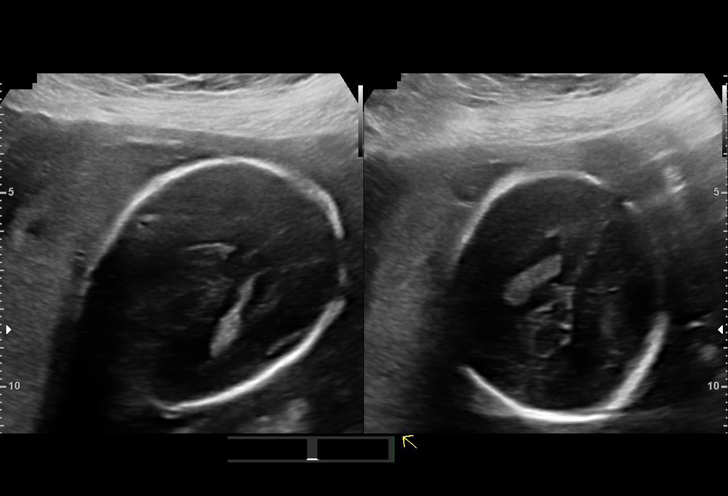
[im 49/74]
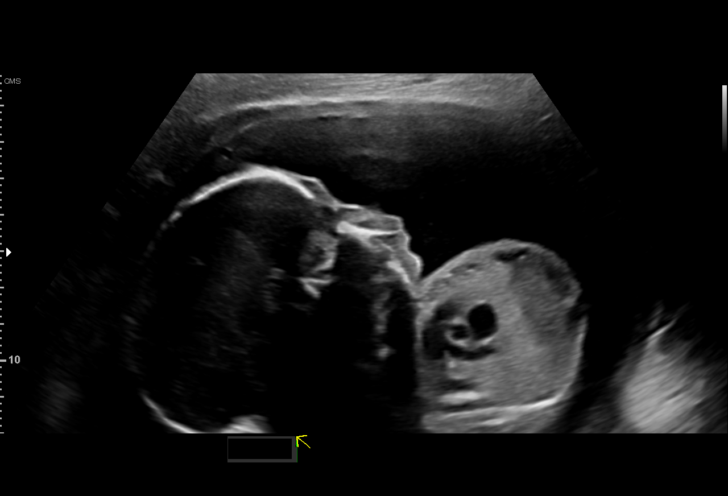
[im 55/74]
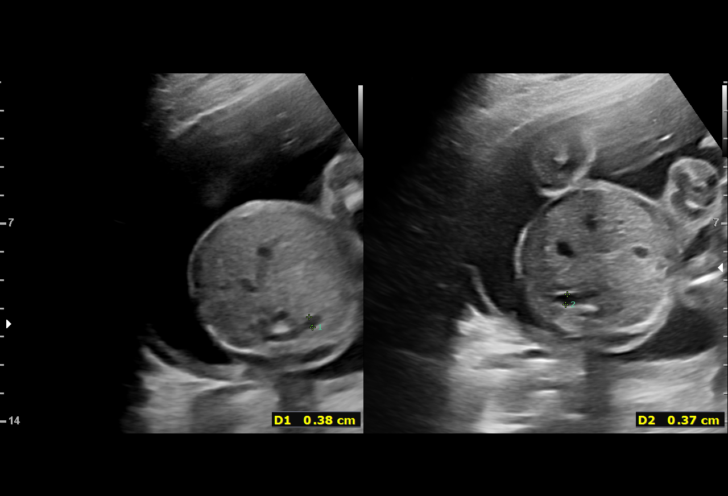
[im 60/74]
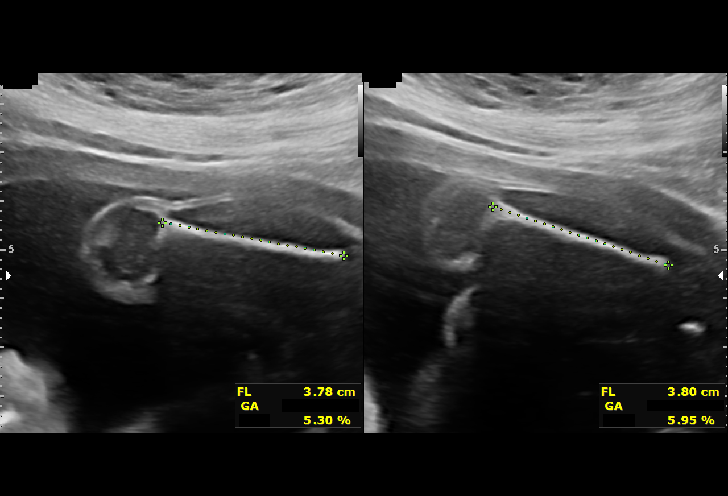
[im 65/74]
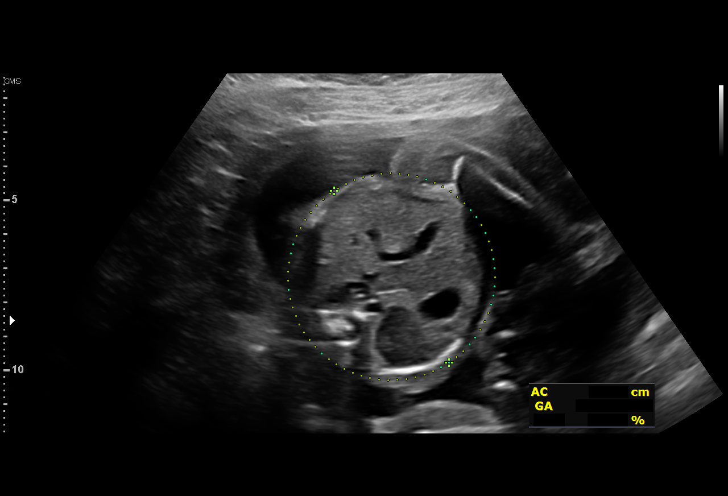
[im 71/74]
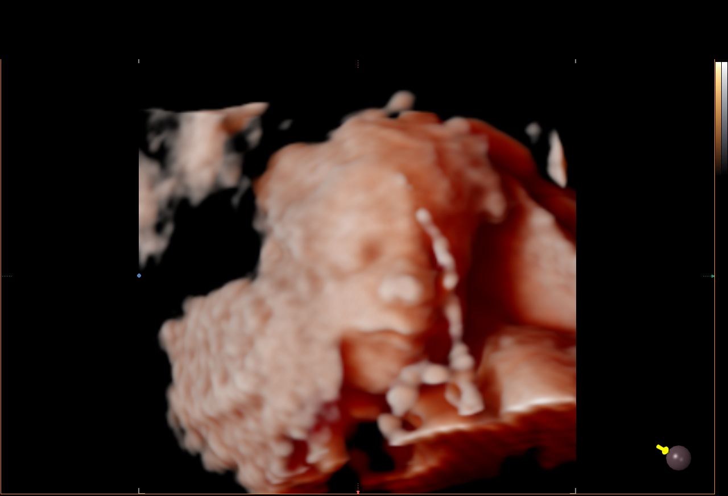

[13 of 28 positions shown; findings below may reference images not displayed]

Indications

 Antenatal follow-up for nonvisualized fetal
 anatomy
 Obesity complicating pregnancy, second
 trimester (pregravid BMI 35)
 LR NIPS, Neg Horizon
 23 weeks gestation of pregnancy
Fetal Evaluation

 Num Of Fetuses:         1
 Fetal Heart Rate(bpm):  147
 Cardiac Activity:       Observed
 Presentation:           Breech
 Placenta:               Anterior
 P. Cord Insertion:      Visualized

 Amniotic Fluid
 AFI FV:      Within normal limits

                             Largest Pocket(cm)

Biometry

 BPD:      57.5  mm     G. Age:  23w 4d         45  %    CI:        71.29   %    70 - 86
                                                         FL/HC:      17.5   %    18.7 -
 HC:      216.9  mm     G. Age:  23w 5d         40  %    HC/AC:      1.13        1.05 -
 AC:      192.8  mm     G. Age:  24w 0d         55  %    FL/BPD:     66.1   %    71 - 87
 FL:         38  mm     G. Age:  22w 1d          6  %    FL/AC:      19.7   %    20 - 24
 HUM:      36.2  mm     G. Age:  22w 5d         20  %
 CER:      25.9  mm     G. Age:  23w 3d         64  %

 LV:        6.8  mm

 Est. FW:     578  gm      1 lb 4 oz     28  %
OB History

 Gravidity:    1
 Living:       0
Gestational Age

 LMP:           23w 4d        Date:  02/18/21                 EDD:   11/25/21
 U/S Today:     23w 3d                                        EDD:   11/26/21
 Best:          23w 4d     Det. By:  LMP  (02/18/21)          EDD:   11/25/21
Anatomy

 Cranium:               Appears normal         LVOT:                   Appears normal
 Cavum:                 Appears normal         Aortic Arch:            Appears normal
 Ventricles:            Appears normal         Ductal Arch:            Appears normal
 Choroid Plexus:        Appears normal         Diaphragm:              Appears normal
 Cerebellum:            Appears normal         Stomach:                Appears normal, left
                                                                       sided
 Posterior Fossa:       Appears normal         Abdomen:                Appears normal
 Nuchal Fold:           Previously seen        Abdominal Wall:         Appears nml (cord
                                                                       insert, abd wall)
 Face:                  Appears normal         Cord Vessels:           Appears normal (3
                        (orbits and profile)                           vessel cord)
 Lips:                  Appears normal         Kidneys:                Appear normal
 Palate:                Appears normal         Bladder:                Appears normal
 Thoracic:              Appears normal         Spine:                  Previously seen
 Heart:                 Appears normal         Upper Extremities:      Previously seen
                        (4CH, axis, and
                        situs)
 RVOT:                  Appears normal         Lower Extremities:      Previously seen

 Other:  Heels/feet and open hands/5th digits previously visualized. Nasal
         bone, lenses,3VV, 3VT, and VC visualized. Female gender previously
         seen.
Cervix Uterus Adnexa

 Cervix
 Length:            3.4  cm.
 Normal appearance by transabdominal scan.

 Uterus
 No abnormality visualized.

 Right Ovary
 Not visualized.

 Left Ovary
 Not visualized.
 Cul De Sac
 No free fluid seen.

 Adnexa
 No abnormality visualized.
Impression

 Patient returned for completion of fetal anatomy .Amniotic
 fluid is normal and good fetal activity is seen .Fetal biometry
 is consistent with her previously-established dates .Fetal
 anatomical survey was completed and appears normal.
Recommendations

 -An appointment was made for her to return in 9 weeks for
 fetal growth assessment (prepreg BMI 35).
                 Adlaho, Deeqa Rayaan

## 2022-12-12 ENCOUNTER — Other Ambulatory Visit: Payer: Self-pay | Admitting: Obstetrics

## 2022-12-12 ENCOUNTER — Telehealth: Payer: Self-pay | Admitting: *Deleted

## 2022-12-12 DIAGNOSIS — Z30011 Encounter for initial prescription of contraceptive pills: Secondary | ICD-10-CM

## 2022-12-12 NOTE — Telephone Encounter (Signed)
Pt calling about refill OCP. RX was already sent 1 hour ago. Pt advised of pharmacy location and time sent.

## 2023-11-16 ENCOUNTER — Telehealth: Payer: Self-pay

## 2023-11-16 NOTE — Telephone Encounter (Signed)
Returned call, no answer, left vm 

## 2023-11-23 ENCOUNTER — Other Ambulatory Visit: Payer: Self-pay | Admitting: Obstetrics

## 2023-11-23 DIAGNOSIS — Z30011 Encounter for initial prescription of contraceptive pills: Secondary | ICD-10-CM

## 2024-03-23 ENCOUNTER — Emergency Department (HOSPITAL_COMMUNITY)

## 2024-03-23 ENCOUNTER — Emergency Department (HOSPITAL_COMMUNITY)
Admission: EM | Admit: 2024-03-23 | Discharge: 2024-03-23 | Disposition: A | Discharge status: Home / Self Care | Attending: Emergency Medicine | Admitting: Emergency Medicine

## 2024-03-23 DIAGNOSIS — Y9301 Activity, walking, marching and hiking: Secondary | ICD-10-CM | POA: Insufficient documentation

## 2024-03-23 DIAGNOSIS — S93401A Sprain of unspecified ligament of right ankle, initial encounter: Secondary | ICD-10-CM | POA: Insufficient documentation

## 2024-03-23 DIAGNOSIS — S99911A Unspecified injury of right ankle, initial encounter: Secondary | ICD-10-CM | POA: Diagnosis present

## 2024-03-23 DIAGNOSIS — W109XXA Fall (on) (from) unspecified stairs and steps, initial encounter: Secondary | ICD-10-CM | POA: Insufficient documentation

## 2024-03-23 DIAGNOSIS — S93409A Sprain of unspecified ligament of unspecified ankle, initial encounter: Secondary | ICD-10-CM

## 2024-03-23 NOTE — ED Provider Notes (Signed)
 [facility] AT [facility] Provider Note   CSN: [id number] Arrival date & time: 03/23/24  1202     History  Chief Complaint  Patient presents with   Foot Pain    Pamela Cisneros is a 23 y.o. female.   Foot Pain  Patient was walking down the stairs yesterday.  Stepped wrong tripped and landed on her right ankle.  Right foot inverted.  Has had pain since and pain with walking.  No other injury.     Home Medications Prior to Admission medications   Medication Sig Start Date End Date Taking? Authorizing Provider  acetaminophen  (TYLENOL ) 325 MG tablet Take 2 tablets (650 mg total) by mouth every 4 (four) hours as needed (for pain scale < 4). 11/26/21   Abner Ables, MD  Blood Pressure Monitoring (BLOOD PRESSURE KIT) DEVI 1 kit by Does not apply route once a week. 04/20/21   Ervin, Michael L, MD  ferrous sulfate  325 (65 FE) MG tablet Take 1 tablet (325 mg total) by mouth every other day. 09/07/21   Alric Jensen, CNM  ibuprofen  (ADVIL ) 600 MG tablet Take 1 tablet (600 mg total) by mouth every 6 (six) hours. 11/26/21   Abner Ables, MD  ibuprofen  (ADVIL ) 800 MG tablet Take 1 tablet (800 mg total) by mouth 3 (three) times daily with meals as needed for headache or moderate pain. 12/08/21   Ervin, Michael L, MD  KURVELO 0.15-30 MG-MCG tablet TAKE 1 TABLET BY MOUTH DAILY 11/23/23   Granville Layer, MD  lidocaine  (XYLOCAINE ) 2 % jelly Apply 1 application topically as needed. 12/08/21   Ervin, Michael L, MD  NIFEdipine  (ADALAT  CC) 30 MG 24 hr tablet Take 1 tablet (30 mg total) by mouth daily. 11/26/21   Abner Ables, MD  oxyCODONE -acetaminophen  (PERCOCET/ROXICET) 5-325 MG tablet Take 1-2 tablets by mouth every 6 (six) hours as needed. 12/08/21   Ervin, Michael L, MD  Prenatal MV & Min w/FA-DHA (PRENATAL GUMMIES) 0.18-25 MG CHEW Chew by mouth.    [provider]      Allergies    Penicillins    Review of Systems    Review of Systems  Physical Exam Updated Vital Signs BP (!) 137/95 (BP Location: Left Arm)   Pulse 85   Temp 97.7 F (36.5 C) (Oral)   Resp 16   LMP 03/14/2024   SpO2 100%  Physical Exam Vitals and nursing note reviewed.  Musculoskeletal:     Comments: Tenderness over lateral malleolus of right ankle.  Some swelling but no frank deformity.  No tenderness over proximal fibula.  No foot tenderness.  Neurological:     Mental Status: She is alert.     ED Results / Procedures / Treatments   Labs (all labs ordered are listed, but only abnormal results are displayed) Labs Reviewed - No data to display  EKG None  Radiology DG Ankle Complete Right Result Date: 03/23/2024 CLINICAL DATA:  Tripped on stairs yesterday. Patient heard a pop in the right ankle. Ankle pain. EXAM: RIGHT ANKLE - COMPLETE 3 VIEW COMPARISON:  None Available. FINDINGS: Soft tissue swelling is present over the lateral malleolus. No underlying fracture is present. The joint is located. Visualized foot is within normal limits. IMPRESSION: Soft tissue swelling over the lateral malleolus without underlying fracture. Electronically Signed   By: Audree Leas M.D.   On: 03/23/2024 13:10    Procedures Procedures    Medications Ordered in ED Medications - No  data to display  ED Course/ Medical Decision Making/ [name]                                 Medical Decision Making Amount and/or Complexity of Data Reviewed Radiology: ordered.   Patient with fall.  Right ankle pain.  Differential diagnose includes mostly sprain versus fracture.  X-ray done and independently troponin shows no fracture.  Has moderate pain.  Will give cam walker and follow-up with Ortho as needed.  Will discharge home.        Final Clinical Impression(s) / ED Diagnoses Final diagnoses:  Sprain of ankle, unspecified laterality, unspecified ligament, initial encounter    Rx / DC Orders ED Discharge Orders     None          Mozell Arias, MD 03/23/24 1320

## 2024-03-23 NOTE — ED Triage Notes (Signed)
 Pt states that yesterday she was taking a stroller up a set of steps when she tripped and injured her R ankle. Pt states she "heard a pop". Difficulty with ambulation since incident.

## 2024-04-17 ENCOUNTER — Encounter: Payer: Self-pay | Admitting: Obstetrics and Gynecology

## 2024-04-17 ENCOUNTER — Ambulatory Visit: Admitting: Obstetrics and Gynecology

## 2024-04-17 VITALS — BP 137/90 | HR 98 | Ht 64.0 in | Wt 241.0 lb

## 2024-04-17 DIAGNOSIS — Z124 Encounter for screening for malignant neoplasm of cervix: Secondary | ICD-10-CM | POA: Diagnosis not present

## 2024-04-17 DIAGNOSIS — Z8759 Personal history of other complications of pregnancy, childbirth and the puerperium: Secondary | ICD-10-CM

## 2024-04-17 DIAGNOSIS — Z1339 Encounter for screening examination for other mental health and behavioral disorders: Secondary | ICD-10-CM

## 2024-04-17 DIAGNOSIS — Z01419 Encounter for gynecological examination (general) (routine) without abnormal findings: Secondary | ICD-10-CM

## 2024-04-17 DIAGNOSIS — Z113 Encounter for screening for infections with a predominantly sexual mode of transmission: Secondary | ICD-10-CM | POA: Diagnosis not present

## 2024-04-17 NOTE — Progress Notes (Unsigned)
 Pt presents for AEX.  No PAP Hx  Declines BC Requesting STD testing   Pt c/o discomfort in the left breast for about 1 month

## 2024-04-17 NOTE — Progress Notes (Unsigned)
 ANNUAL EXAM Patient name: Pamela Cisneros MRN [id number]  Date of birth: 02/29/2001 Chief Complaint:   No chief complaint on file.  History of Present Illness:   Pamela Cisneros is a 23 y.o. G39P1001  female being seen today for a routine annual exam.  Current complaints: Denies pelvic or vaginal complaints.  Contraceptive method: condoms Regular monthly cycle  Establishing with PCP    Patient's last menstrual period was 04/11/2024.   The pregnancy intention screening data noted above was reviewed. Potential methods of contraception were discussed. The patient elected to proceed with No data recorded.   Last pap n/a. Results were: N/A. H/O abnormal pap: no Last mammogram: n/a. Results were: N/A. Family h/o breast cancer: no Last colonoscopy: n/a. Results were: N/A. Family h/o colorectal cancer: no     04/17/2024    4:30 PM 11/15/2021    8:27 AM 09/06/2021    8:32 AM 09/06/2021    8:31 AM 04/20/2021    1:38 PM  Depression screen PHQ 2/9  Decreased Interest 0 0 0 0 0  Down, Depressed, Hopeless 0 0 0 0 0  PHQ - 2 Score 0 0 0 0 0  Altered sleeping 0 0 1  1  Tired, decreased energy 0 1 1  1   Change in appetite 0 0 0  1  Feeling bad or failure about yourself  0 0 0  0  Trouble concentrating 0 0 0  1  Moving slowly or fidgety/restless 0 0 0  0  Suicidal thoughts 0 0 0  0  PHQ-9 Score 0 1 2  4   Difficult doing work/chores  Not difficult at all Not difficult at all          04/17/2024    4:30 PM 04/20/2021    1:38 PM  GAD 7 : Generalized Anxiety Score  Nervous, Anxious, on Edge 0 1  Control/stop worrying 0 0  Worry too much - different things 1 1  Trouble relaxing 1 0  Restless 0 0  Easily annoyed or irritable 1 1  Afraid - awful might happen 0 0  Total GAD 7 Score 3 3     Review of Systems:   Pertinent items are noted in HPI Denies any headaches, blurred vision, fatigue, shortness of breath, chest pain, abdominal pain, abnormal vaginal  discharge/itching/odor/irritation, problems with periods, bowel movements, urination, or intercourse unless otherwise stated above. Pertinent History Reviewed:  Reviewed past medical,surgical, social and family history.  Reviewed problem list, medications and allergies. Physical Assessment:   Vitals:   04/17/24 1516 04/17/24 1521  BP: (!) 136/90 (!) 137/90  Pulse: 96 98  Weight: 241 lb (109.3 kg)   Height: 5\' 4"  (1.626 m)   Body mass index is 41.37 kg/m.        Physical Examination:   General appearance - well appearing, and in no distress  Mental status - alert, oriented to person, place, and time  Psych:  She has a normal mood and affect  Skin - warm and dry, normal color, no suspicious lesions noted  Chest - effort normal, all lung fields clear to auscultation bilaterally  Heart - normal rate and regular rhythm  Neck:  midline trachea, no thyromegaly or nodules  Breasts - breasts appear normal, no suspicious masses, no skin or nipple changes or  axillary nodes  Abdomen - soft, nontender, nondistended, no masses or organomegaly  Pelvic - VULVA: normal appearing vulva with no masses, tenderness or lesions  VAGINA: normal appearing vagina with  normal color and discharge, no lesions  CERVIX: normal appearing cervix without discharge or lesions, no CMT  Thin prep pap is done   UTERUS: uterus is felt to be normal size, shape, consistency and nontender   ADNEXA: No adnexal masses or tenderness noted.  Extremities:  No swelling or varicosities noted  Chaperone present for exam  No results found for this or any previous visit (from the past 24 hours).  Assessment & Plan:  1. Encounter for annual routine gynecological examination (Primary) First pap today Encouraged routine self breast exams  - Cytology - PAP( )  2. Cervical cancer screening   3. Screening examination for STD (sexually transmitted disease) Desires screening -  RPR+HBsAg+HCVAb+...    Labs/procedures today:   Mammogram: @ 24yo, or sooner if problems Colonoscopy: @ 24yo, or sooner if problems  Orders Placed This Encounter  Procedures   RPR+HBsAg+HCVAb+...    Meds: No orders of the defined types were placed in this encounter.   Follow-up: Return in about 1 year (around 04/17/2025) for Zoanne Hinders, FNP

## 2024-04-18 LAB — RPR+HBSAG+HCVAB+...
HIV Screen 4th Generation wRfx: NONREACTIVE
Hep C Virus Ab: NONREACTIVE
Hepatitis B Surface Ag: NEGATIVE
RPR Ser Ql: NONREACTIVE

## 2024-04-21 ENCOUNTER — Encounter: Payer: Self-pay | Admitting: Obstetrics and Gynecology

## 2024-04-21 ENCOUNTER — Ambulatory Visit: Payer: Self-pay | Admitting: Obstetrics and Gynecology

## 2024-04-21 DIAGNOSIS — Z8759 Personal history of other complications of pregnancy, childbirth and the puerperium: Secondary | ICD-10-CM | POA: Insufficient documentation

## 2024-04-25 ENCOUNTER — Telehealth: Payer: Self-pay | Admitting: *Deleted

## 2024-04-25 NOTE — Telephone Encounter (Signed)
 TC to notify of need for Pap recollection due to lab mislabeling incident. No answer. Left HIPAA compliant VM, call back number, and indication that a detailed MyChart message would be sent.

## 2024-05-06 ENCOUNTER — Ambulatory Visit (INDEPENDENT_AMBULATORY_CARE_PROVIDER_SITE_OTHER): Admitting: Obstetrics and Gynecology

## 2024-05-06 ENCOUNTER — Other Ambulatory Visit (HOSPITAL_COMMUNITY)
Admission: RE | Admit: 2024-05-06 | Discharge: 2024-05-06 | Disposition: A | Discharge status: Home / Self Care | Source: Ambulatory Visit | Attending: Obstetrics and Gynecology | Admitting: Obstetrics and Gynecology

## 2024-05-06 VITALS — BP 130/85 | HR 83 | Ht 63.0 in | Wt 241.2 lb

## 2024-05-06 DIAGNOSIS — Z124 Encounter for screening for malignant neoplasm of cervix: Secondary | ICD-10-CM | POA: Insufficient documentation

## 2024-05-06 DIAGNOSIS — R87615 Unsatisfactory cytologic smear of cervix: Secondary | ICD-10-CM

## 2024-05-06 NOTE — Progress Notes (Signed)
 Pt presents for repeat pap. Pt has no questions or concerns at this time.

## 2024-05-06 NOTE — Progress Notes (Signed)
   GYNECOLOGY PROGRESS NOTE  History:  23 y.o. G1P1001 presents to [facility] for repeat pap. Had problem with cytology sample. Here for repeat.   Health Maintenance Due  Topic Date Due   HPV VACCINES (1 - 3-dose series) Never done   Meningococcal B Vaccine (1 of 2 - Standard) Never done   Cervical Cancer Screening (Pap smear)  Never done   CHLAMYDIA SCREENING  11/03/2022   COVID-19 Vaccine (1 - 2024-25 season) Never done     Review of Systems:  Pertinent items are noted in HPI.   Objective:  Physical Exam Blood pressure 130/85, pulse 83, height 5\' 3"  (1.6 m), weight 241 lb 3.2 oz (109.4 kg), last menstrual period 04/11/2024. VS reviewed, nursing note reviewed,  Constitutional: well developed, well nourished, no distress HEENT: normocephalic CV: normal rate Pulm/chest wall: normal effort Breast Exam: deferred Abdomen: soft Neuro: alert and oriented x 3 Skin: warm, dry Psych: affect normal Pelvic exam:Pelvic: normal appearing vulva with no masses, tenderness or lesions  VAGINA: normal appearing vagina with normal color and discharge, no lesions  CERVIX: normal appearing cervix without discharge or lesions, no CMT  Thin prep pap is done   Extremities:  No swelling or varicosities noted    Assessment & Plan:  1. Cervical cancer screening (Primary) Repeat pap collected - Cytology - PAP( [facility])   Susi Eric, FNP 8:57 PM

## 2024-05-12 ENCOUNTER — Ambulatory Visit: Payer: Self-pay | Admitting: Obstetrics and Gynecology

## 2024-05-12 LAB — CYTOLOGY - PAP: Diagnosis: NEGATIVE
